# Patient Record
Sex: Male | Born: 1985 | Race: White | Hispanic: No | Marital: Single | Smoking: Current every day smoker
Health system: Southern US, Community
[De-identification: ages and names within clinical notes are randomized; demographics above are authoritative.]

## PROBLEM LIST (undated history)

## (undated) DIAGNOSIS — J45909 Unspecified asthma, uncomplicated: Secondary | ICD-10-CM

## (undated) DIAGNOSIS — IMO0002 Reserved for concepts with insufficient information to code with codable children: Secondary | ICD-10-CM

## (undated) DIAGNOSIS — Z8739 Personal history of other diseases of the musculoskeletal system and connective tissue: Secondary | ICD-10-CM

## (undated) DIAGNOSIS — F1910 Other psychoactive substance abuse, uncomplicated: Secondary | ICD-10-CM

## (undated) DIAGNOSIS — M79606 Pain in leg, unspecified: Secondary | ICD-10-CM

## (undated) DIAGNOSIS — I8390 Asymptomatic varicose veins of unspecified lower extremity: Secondary | ICD-10-CM

## (undated) HISTORY — PX: NO PAST SURGERIES: SHX2092

## (undated) HISTORY — DX: Reserved for concepts with insufficient information to code with codable children: IMO0002

## (undated) HISTORY — DX: Pain in leg, unspecified: M79.606

---
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
## 2008-06-13 ENCOUNTER — Emergency Department (HOSPITAL_COMMUNITY): Admission: EM | Admit: 2008-06-13 | Discharge: 2008-06-13 | Payer: Self-pay | Admitting: Emergency Medicine

## 2009-04-28 ENCOUNTER — Emergency Department (HOSPITAL_COMMUNITY): Admission: EM | Admit: 2009-04-28 | Discharge: 2009-04-28 | Payer: Self-pay | Admitting: Emergency Medicine

## 2009-05-14 ENCOUNTER — Emergency Department (HOSPITAL_COMMUNITY): Admission: EM | Admit: 2009-05-14 | Discharge: 2009-05-14 | Payer: Self-pay | Admitting: Emergency Medicine

## 2009-11-01 ENCOUNTER — Emergency Department (HOSPITAL_COMMUNITY): Admission: EM | Admit: 2009-11-01 | Discharge: 2009-11-01 | Payer: Self-pay | Admitting: Emergency Medicine

## 2010-03-22 ENCOUNTER — Emergency Department (HOSPITAL_COMMUNITY): Admission: EM | Admit: 2010-03-22 | Discharge: 2010-03-22 | Payer: Self-pay | Admitting: Emergency Medicine

## 2011-04-24 ENCOUNTER — Emergency Department (HOSPITAL_COMMUNITY)
Admission: EM | Admit: 2011-04-24 | Discharge: 2011-04-24 | Disposition: A | Discharge status: Home / Self Care | Payer: Self-pay | Attending: Emergency Medicine | Admitting: Emergency Medicine

## 2011-04-24 DIAGNOSIS — M25559 Pain in unspecified hip: Secondary | ICD-10-CM | POA: Insufficient documentation

## 2011-04-24 DIAGNOSIS — I8390 Asymptomatic varicose veins of unspecified lower extremity: Secondary | ICD-10-CM | POA: Insufficient documentation

## 2011-04-24 DIAGNOSIS — Z79899 Other long term (current) drug therapy: Secondary | ICD-10-CM | POA: Insufficient documentation

## 2011-04-24 DIAGNOSIS — M76899 Other specified enthesopathies of unspecified lower limb, excluding foot: Secondary | ICD-10-CM | POA: Insufficient documentation

## 2012-03-30 ENCOUNTER — Telehealth: Payer: Self-pay | Admitting: *Deleted

## 2012-03-30 NOTE — Telephone Encounter (Signed)
Douglas Morgan is calling on behalf of her son inquiring about consultation with physician at [facility] as her son is experiencing groin pain and is having pain from groin to foot and is having difficulty sleeping.  She said he also has varicose veins behind his knee that are painful.  Douglas Morgan states Douglas was seen by a cardiologist (she does not know physician or practice name) at age 26 and was told "that he needed stents placed in his legs." She says they deferred treatment because they have no health insurance. Douglas Morgan states he was seen at [facility] ED about 6 months ago.  Reviewed Douglas Morgan's ED report from 04-23-2012 in which his primary complaint was hip pain and diagnosis was bursitis of right and left hips and leg pain was mentioned due to varicose veins.  No imaging studies done on ED visit.  Will discuss findings with Dr. Arbie Cookey 03-31-2012 and call Douglas Morgan and Douglas Morgan with Dr. Bosie Helper recommendations.  Douglas Morgan, Douglas Morgan

## 2012-03-31 NOTE — Telephone Encounter (Signed)
Reviewed Douglas Morgan's symptoms and records of Douglas Morgan ED visit on 04-24-2011 with Dr. Arbie Cookey.  Dr. Arbie Cookey recommended Douglas Morgan see his primary care physician first and obtain referral if primary care physician deemed necessary.  Called Douglas Morgan ([name]'s mother) and gave her Dr. Bosie Helper recommendation and suggested she try to obtain medical records from the cardiologist visit referenced in phone call on 03-30-2012.  [name], [name]

## 2012-04-04 ENCOUNTER — Encounter (HOSPITAL_COMMUNITY): Payer: Self-pay

## 2012-04-04 ENCOUNTER — Emergency Department (HOSPITAL_COMMUNITY)
Admission: EM | Admit: 2012-04-04 | Discharge: 2012-04-04 | Disposition: A | Discharge status: Home / Self Care | Payer: Self-pay | Attending: Emergency Medicine | Admitting: Emergency Medicine

## 2012-04-04 DIAGNOSIS — M79609 Pain in unspecified limb: Secondary | ICD-10-CM

## 2012-04-04 DIAGNOSIS — F17200 Nicotine dependence, unspecified, uncomplicated: Secondary | ICD-10-CM | POA: Insufficient documentation

## 2012-04-04 DIAGNOSIS — I8390 Asymptomatic varicose veins of unspecified lower extremity: Secondary | ICD-10-CM

## 2012-04-04 DIAGNOSIS — Z9109 Other allergy status, other than to drugs and biological substances: Secondary | ICD-10-CM | POA: Insufficient documentation

## 2012-04-04 DIAGNOSIS — R590 Localized enlarged lymph nodes: Secondary | ICD-10-CM

## 2012-04-04 DIAGNOSIS — I83893 Varicose veins of bilateral lower extremities with other complications: Secondary | ICD-10-CM | POA: Insufficient documentation

## 2012-04-04 DIAGNOSIS — R599 Enlarged lymph nodes, unspecified: Secondary | ICD-10-CM | POA: Insufficient documentation

## 2012-04-04 HISTORY — DX: Asymptomatic varicose veins of unspecified lower extremity: I83.90

## 2012-04-04 MED ORDER — DOXYCYCLINE HYCLATE 100 MG PO CAPS: 100.0000 mg | ORAL_CAPSULE | Freq: Two times a day (BID) | ORAL | Status: AC

## 2012-04-04 MED ORDER — OXYCODONE-ACETAMINOPHEN 5-325 MG PO TABS: 1.0000 | ORAL_TABLET | ORAL | Status: AC | PRN

## 2012-04-04 NOTE — Progress Notes (Signed)
VASCULAR LAB PRELIMINARY  PRELIMINARY  PRELIMINARY  PRELIMINARY  Bilateral lower extremity venous duplex  completed.    Preliminary report:  Bilateral:  No evidence of DVT, superficial thrombosis, or Baker's Cyst.   Terance Hart, RVT 04/04/2012, 12:54 PM

## 2012-04-04 NOTE — ED Notes (Signed)
Pt presented to th ER with complaint of rt leg pain that got worse the last couple of days. Pt has a hx of  varicose veins to the rt leg and has gotten worse the last couple of weeks due to patient being on his feet a lot. Pt is ambulatory but is limping.

## 2012-04-04 NOTE — ED Provider Notes (Signed)
History   This chart was scribed for Dione Booze, MD by Shari Heritage. The patient was seen in room STRE8/STRE8. Patient's care was started at 1035.     CSN: [id number]  Arrival date & time 04/04/12  1035   First MD Initiated Contact with Patient 04/04/12 1126      Chief Complaint  Patient presents with  . Varicose Veins    (Consider location/radiation/quality/duration/timing/severity/associated sxs/prior treatment) Patient is a 26 y.o. male presenting with leg pain.  Leg Pain  The incident occurred more than 2 days ago. The incident occurred at home. There was no injury mechanism. The pain is present in the right leg and right thigh. The quality of the pain is described as throbbing and aching. The pain is at a severity of 8/10. The pain is severe. The pain has been constant since onset. Associated symptoms include numbness. The symptoms are aggravated by activity and bearing weight.   Douglas Morgan is a 27 y.o. male who presents to the Emergency Department complaining of moderate to severe right leg and groin pain with associated numbness onset several days ago. Patient complains of pain in his right ankle, anterior right lower leg, and groin. Patient describes pain as achy and throbbing. Patient says pain is 8/10. Patient says he began having this pain when he was 19. Patient says that pain is often aggravated by his work [employer]). Patient is ambulatory.  Patient's mother explains that when he was first seen by a cardiologist at age 34, that patient is able to pump blood into his legs but not out of his legs. Patient says he received these results through an ultrasound. Patient reports that he needs a referral to [facility] [address].  Patient has an allergy to pollen extract. Patient with h/o varicose veins. Patient is a current everyday smoker. Patient says he doesn't drink.  Patient has no PCP.  Past Medical History  Diagnosis Date  . Varicose veins      History reviewed. No pertinent past surgical history.  No family history on file.  History  Substance Use Topics  . Smoking status: Current Everyday Smoker -- 0.5 packs/day  . Smokeless tobacco: Not on file  . Alcohol Use: Yes     very rarely      Review of Systems  Neurological: Positive for numbness.   A complete 10 system review of systems was obtained and all systems are negative except as noted in the HPI and PMH.   Allergies  Pollen extract  Home Medications  No current outpatient prescriptions on file.  BP 109/79  Pulse 80  Temp 98.3 F (36.8 C) (Oral)  Resp 16  Ht 6\' 4"  (1.93 m)  Wt 187 lb (84.823 kg)  BMI 22.76 kg/m2  SpO2 99%  Physical Exam  Nursing note and vitals reviewed. Constitutional: He is oriented to person, place, and time. He appears well-developed and well-nourished. No distress.  HENT:  Head: Normocephalic and atraumatic.  Eyes: Conjunctivae and EOM are normal.  Neck: Neck supple.  Cardiovascular: Normal rate.   Pulmonary/Chest: Effort normal. No respiratory distress.  Musculoskeletal: Normal range of motion.       Right upper leg: He exhibits tenderness. He exhibits no edema.       Right lower leg: He exhibits tenderness. He exhibits no edema.       Varicose veins present on the right leg. Right calf and right thigh have mild tenderness and palpation. No edema. No cord. No  erythema. No difference in calf circumference. Early varicose vein formation on the left calf. Bilateral inguinal adenopathy which is mildly tender.  Neurological: He is alert and oriented to person, place, and time. No sensory deficit.  Skin: Skin is dry.       Well circumcised at area of erythema in the right anterior lower leg approximately 2 cm in diameter. Lesion is flat does not seem to be related in any way to the varicose veins. Most likely a contact dermatitis.  Psychiatric: He has a normal mood and affect. His behavior is normal.    ED Course  Procedures  (including critical care time) DIAGNOSTIC STUDIES: Oxygen Saturation is 99% on room air, normal by my interpretation.    COORDINATION OF CARE: 11:40AM- Patient informed of current plan for treatment and evaluation and agrees with plan at this time. Will order an ultrasound.    1. Varicose veins   2. Inguinal lymphadenopathy       MDM  Varicose veins without evidence on physical exam of DVT. Patient states that he was told to calm me here by the vein specialists in order to be referred back to the vein specialists. Venous Doppler will be obtained to rule out occult DVT. Inguinal pain seems most likely due 2 inguinal lymphadenopathy. I do not see any evidence of iliofemoral thrombophlebitis. Prior records are reviewed and apparently he has a telephone conversation with the nurse on call for vascular and vein specialists 5 days ago and he was advised to get a referral from his primary care provider to be seen there. He also has a prior ED visit for what was diagnosed as bursitis of his right hip. I do not see any records of cardiology evaluation or a prior evaluation by vascular or vein specialists and had no mention of varicose veins.  Ultrasound showed no evidence of DVT. Cause of the size of his veins, and you feel it would be prudent to take daily aspirin to try to prevent superficial thrombophlebitis. He is referred to vascular and vein specialists and is given a prescription for Percocet for pain. Inguinal lymphadenopathy is being treated with a prescription for doxycycline.      I personally performed the services described in this documentation, which was scribed in my presence. The recorded information has been reviewed and considered.      Dione Booze, MD 04/04/12 1335

## 2012-04-04 NOTE — Discharge Instructions (Signed)
Take one aspirin every day. Make an appointment with a pain specialist to see what options you have to treat your varicose veins. Take Tylenol or ibuprofen for less severe pain.  Lymphadenopathy Lymphadenopathy means "disease of the lymph glands." But the term is usually used to describe swollen or enlarged lymph glands, also called lymph nodes. These are the bean-shaped organs found in many locations including the neck, underarm, and groin. Lymph glands are part of the immune system, which fights infections in your body. Lymphadenopathy can occur in just one area of the body, such as the neck, or it can be generalized, with lymph node enlargement in several areas. The nodes found in the neck are the most common sites of lymphadenopathy. CAUSES  When your immune system responds to germs (such as viruses or bacteria ), infection-fighting cells and fluid build up. This causes the glands to grow in size. This is usually not something to worry about. Sometimes, the glands themselves can become infected and inflamed. This is called lymphadenitis. Enlarged lymph nodes can be caused by many diseases:  Bacterial disease, such as strep throat or a skin infection.   Viral disease, such as a common cold.   Other germs, such as lyme disease, tuberculosis, or sexually transmitted diseases.   Cancers, such as lymphoma (cancer of the lymphatic system) or leukemia (cancer of the white blood cells).   Inflammatory diseases such as lupus or rheumatoid arthritis.   Reactions to medications.  Many of the diseases above are rare, but important. This is why you should see your caregiver if you have lymphadenopathy. SYMPTOMS   Swollen, enlarged lumps in the neck, back of the head or other locations.   Tenderness.   Warmth or redness of the skin over the lymph nodes.   Fever.  DIAGNOSIS  Enlarged lymph nodes are often near the source of infection. They can help healthcare providers diagnose your illness. For  instance:   Swollen lymph nodes around the jaw might be caused by an infection in the mouth.   Enlarged glands in the neck often signal a throat infection.   Lymph nodes that are swollen in more than one area often indicate an illness caused by a virus.  Your caregiver most likely will know what is causing your lymphadenopathy after listening to your history and examining you. Blood tests, x-rays or other tests may be needed. If the cause of the enlarged lymph node cannot be found, and it does not go away by itself, then a biopsy may be needed. Your caregiver will discuss this with you. TREATMENT  Treatment for your enlarged lymph nodes will depend on the cause. Many times the nodes will shrink to normal size by themselves, with no treatment. Antibiotics or other medicines may be needed for infection. Only take over-the-counter or prescription medicines for pain, discomfort or fever as directed by your caregiver. HOME CARE INSTRUCTIONS  Swollen lymph glands usually return to normal when the underlying medical condition goes away. If they persist, contact your health-care provider. He/she might prescribe antibiotics or other treatments, depending on the diagnosis. Take any medications exactly as prescribed. Keep any follow-up appointments made to check on the condition of your enlarged nodes.  SEEK MEDICAL CARE IF:   Swelling lasts for more than two weeks.   You have symptoms such as weight loss, night sweats, fatigue or fever that does not go away.   The lymph nodes are hard, seem fixed to the skin or are growing rapidly.  Skin over the lymph nodes is red and inflamed. This could mean there is an infection.  SEEK IMMEDIATE MEDICAL CARE IF:   Fluid starts leaking from the area of the enlarged lymph node.   You develop a fever of 102 F (38.9 C) or greater.   Severe pain develops (not necessarily at the site of a large lymph node).   You develop chest pain or shortness of breath.   You  develop worsening abdominal pain.  MAKE SURE YOU:   Understand these instructions.   Will watch your condition.   Will get help right away if you are not doing well or get worse.  Document Released: 07/14/2008 Document Revised: 09/24/2011 Document Reviewed: 07/14/2008 [facility] Patient Information 2012 [place], [place].  Varicose Veins Varicose veins are veins that have become enlarged and twisted. CAUSES This condition is the result of valves in the veins not working properly. Valves in the veins help return blood from the leg to the heart. If these valves are damaged, blood flows backwards and backs up into the veins in the leg near the skin. This causes the veins to become larger. People who are on their feet a lot, who are pregnant, or who are overweight are more likely to develop varicose veins. SYMPTOMS   Bulging, twisted-appearing, bluish veins, most commonly found on the legs.   Leg pain or a feeling of heaviness. These symptoms may be worse at the end of the day.   Leg swelling.   Skin color changes.  DIAGNOSIS  Varicose veins can usually be diagnosed with an exam of your legs by your caregiver. He or she may recommend an ultrasound of your leg veins. TREATMENT  Most varicose veins can be treated at home.However, other treatments are available for people who have persistent symptoms or who want to treat the cosmetic appearance of the varicose veins. These include:  Laser treatment of very small varicose veins.   Medicine that is shot (injected) into the vein. This medicine hardens the walls of the vein and closes off the vein. This treatment is called sclerotherapy. Afterwards, you may need to wear clothing or bandages that apply pressure.   Surgery.  HOME CARE INSTRUCTIONS   Do not stand or sit in one position for long periods of time. Do not sit with your legs crossed. Rest with your legs raised during the day.   Wear elastic stockings or support hose. Do not wear  other tight, encircling garments around the legs, pelvis, or waist.   Walk as much as possible to increase blood flow.   Raise the foot of your bed at night with 2-inch blocks.   If you get a cut in the skin over the vein and the vein bleeds, lie down with your leg raised and press on it with a clean cloth until the bleeding stops. Then place a bandage (dressing) on the cut. See your caregiver if it continues to bleed or needs stitches.  SEEK MEDICAL CARE IF:   The skin around your ankle starts to break down.   You have pain, redness, tenderness, or hard swelling developing in your leg over a vein.   You are uncomfortable due to leg pain.  Document Released: 07/15/2005 Document Revised: 09/24/2011 Document Reviewed: 12/01/2010 [facility] Patient Information 2012 [place], [place].  Doxycycline tablets or capsules What is this medicine? DOXYCYCLINE (dox i SYE kleen) is a tetracycline antibiotic. It kills certain bacteria or stops their growth. It is used to treat many kinds of infections,  like dental, skin, respiratory, and urinary tract infections. It also treats acne, Lyme disease, malaria, and certain sexually transmitted infections. This medicine may be used for other purposes; ask your health care provider or pharmacist if you have questions. What should I tell my health care provider before I take this medicine? They need to know if you have any of these conditions: -liver disease -long exposure to sunlight like working outdoors -stomach problems like colitis -an unusual or allergic reaction to doxycycline, tetracycline antibiotics, other medicines, foods, dyes, or preservatives -pregnant or trying to get pregnant -breast-feeding How should I use this medicine? Take this medicine by mouth with a full glass of water. Follow the directions on the prescription label. It is best to take this medicine without food, but if it upsets your stomach take it with food. Take your medicine at  regular intervals. Do not take your medicine more often than directed. Take all of your medicine as directed even if you think you are better. Do not skip doses or stop your medicine early. Talk to your pediatrician regarding the use of this medicine in children. Special care may be needed. While this drug may be prescribed for children as young as 67 years old for selected conditions, precautions do apply. Overdosage: If you think you have taken too much of this medicine contact a poison control center or emergency room at once. NOTE: This medicine is only for you. Do not share this medicine with others. What if I miss a dose? If you miss a dose, take it as soon as you can. If it is almost time for your next dose, take only that dose. Do not take double or extra doses. What may interact with this medicine? -antacids -barbiturates -birth control pills -bismuth subsalicylate -carbamazepine -methoxyflurane -other antibiotics -phenytoin -vitamins that contain iron -warfarin This list may not describe all possible interactions. Give your health care provider a list of all the medicines, herbs, non-prescription drugs, or dietary supplements you use. Also tell them if you smoke, drink alcohol, or use illegal drugs. Some items may interact with your medicine. What should I watch for while using this medicine? Tell your doctor or health care professional if your symptoms do not improve. Do not treat diarrhea with over the counter products. Contact your doctor if you have diarrhea that lasts more than 2 days or if it is severe and watery. Do not take this medicine just before going to bed. It may not dissolve properly when you lay down and can cause pain in your throat. Drink plenty of fluids while taking this medicine to also help reduce irritation in your throat. This medicine can make you more sensitive to the sun. Keep out of the sun. If you cannot avoid being in the sun, wear protective clothing and  use sunscreen. Do not use sun lamps or tanning beds/booths. Birth control pills may not work properly while you are taking this medicine. Talk to your doctor about using an extra method of birth control. If you are being treated for a sexually transmitted infection, avoid sexual contact until you have finished your treatment. Your sexual partner may also need treatment. Avoid antacids, aluminum, calcium, magnesium, and iron products for 4 hours before and 2 hours after taking a dose of this medicine. If you are using this medicine to prevent malaria, you should still protect yourself from contact with mosquitos. Stay in screened-in areas, use mosquito nets, keep your body covered, and use an insect repellent. What side  effects may I notice from receiving this medicine? Side effects that you should report to your doctor or health care professional as soon as possible: -allergic reactions like skin rash, itching or hives, swelling of the face, lips, or tongue -difficulty breathing -fever -itching in the rectal or genital area -pain on swallowing -redness, blistering, peeling or loosening of the skin, including inside the mouth -severe stomach pain or cramps -unusual bleeding or bruising -unusually weak or tired -yellowing of the eyes or skin Side effects that usually do not require medical attention (report to your doctor or health care professional if they continue or are bothersome): -diarrhea -loss of appetite -nausea, vomiting This list may not describe all possible side effects. Call your doctor for medical advice about side effects. You may report side effects to FDA at 1-800-FDA-1088. Where should I keep my medicine? Keep out of the reach of children. Store at room temperature, below 30 degrees C (86 degrees F). Protect from light. Keep container tightly closed. Throw away any unused medicine after the expiration date. Taking this medicine after the expiration date can make you seriously  ill. NOTE: This sheet is a summary. It may not cover all possible information. If you have questions about this medicine, talk to your doctor, pharmacist, or health care provider.  2012, [employer]. (01/24/2008 4:53:02 PM)  Acetaminophen; Oxycodone tablets What is this medicine? ACETAMINOPHEN; OXYCODONE (a set a MEE noe fen; ox i KOE done) is a pain reliever. It is used to treat mild to moderate pain. This medicine may be used for other purposes; ask your health care provider or pharmacist if you have questions. What should I tell my health care provider before I take this medicine? They need to know if you have any of these conditions: -brain tumor -Crohn's disease, inflammatory bowel disease, or ulcerative colitis -drink more than 3 alcohol containing drinks per day -drug abuse or addiction -head injury -heart or circulation problems -kidney disease or problems going to the bathroom -liver disease -lung disease, asthma, or breathing problems -an unusual or allergic reaction to acetaminophen, oxycodone, other opioid analgesics, other medicines, foods, dyes, or preservatives -pregnant or trying to get pregnant -breast-feeding How should I use this medicine? Take this medicine by mouth with a full glass of water. Follow the directions on the prescription label. Take your medicine at regular intervals. Do not take your medicine more often than directed. Talk to your pediatrician regarding the use of this medicine in children. Special care may be needed. Patients over 7 years old may have a stronger reaction and need a smaller dose. Overdosage: If you think you have taken too much of this medicine contact a poison control center or emergency room at once. NOTE: This medicine is only for you. Do not share this medicine with others. What if I miss a dose? If you miss a dose, take it as soon as you can. If it is almost time for your next dose, take only that dose. Do not take  double or extra doses. What may interact with this medicine? -alcohol or medicines that contain alcohol -antihistamines -barbiturates like amobarbital, butalbital, butabarbital, methohexital, pentobarbital, phenobarbital, thiopental, and secobarbital -benztropine -drugs for bladder problems like solifenacin, trospium, oxybutynin, tolterodine, hyoscyamine, and methscopolamine -drugs for breathing problems like ipratropium and tiotropium -drugs for certain stomach or intestine problems like propantheline, homatropine methylbromide, glycopyrrolate, atropine, belladonna, and dicyclomine -general anesthetics like etomidate, ketamine, nitrous oxide, propofol, desflurane, enflurane, halothane, isoflurane, and sevoflurane -medicines for depression, anxiety, or  psychotic disturbances -medicines for pain like codeine, morphine, pentazocine, buprenorphine, butorphanol, nalbuphine, tramadol, and propoxyphene -medicines for sleep -muscle relaxants -naltrexone -phenothiazines like perphenazine, thioridazine, chlorpromazine, mesoridazine, fluphenazine, prochlorperazine, promazine, and trifluoperazine -scopolamine -trihexyphenidyl This list may not describe all possible interactions. Give your health care provider a list of all the medicines, herbs, non-prescription drugs, or dietary supplements you use. Also tell them if you smoke, drink alcohol, or use illegal drugs. Some items may interact with your medicine. What should I watch for while using this medicine? Tell your doctor or health care professional if your pain does not go away, if it gets worse, or if you have new or a different type of pain. You may develop tolerance to the medicine. Tolerance means that you will need a higher dose of the medication for pain relief. Tolerance is normal and is expected if you take this medicine for a long time. Do not suddenly stop taking your medicine because you may develop a severe reaction. Your body becomes used  to the medicine. This does NOT mean you are addicted. Addiction is a behavior related to getting and using a drug for a nonmedical reason. If you have pain, you have a medical reason to take pain medicine. Your doctor will tell you how much medicine to take. If your doctor wants you to stop the medicine, the dose will be slowly lowered over time to avoid any side effects. You may get drowsy or dizzy. Do not drive, use machinery, or do anything that needs mental alertness until you know how this medicine affects you. Do not stand or sit up quickly, especially if you are an older patient. This reduces the risk of dizzy or fainting spells. Alcohol may interfere with the effect of this medicine. Avoid alcoholic drinks. The medicine will cause constipation. Try to have a bowel movement at least every 2 to 3 days. If you do not have a bowel movement for 3 days, call your doctor or health care professional. Do not take Tylenol (acetaminophen) or medicines that have acetaminophen with this medicine. Too much acetaminophen can be very dangerous. Many nonprescription medicines contain acetaminophen. Always read the labels carefully to avoid taking more acetaminophen. What side effects may I notice from receiving this medicine? Side effects that you should report to your doctor or health care professional as soon as possible: -allergic reactions like skin rash, itching or hives, swelling of the face, lips, or tongue -breathing difficulties, wheezing -confusion -light headedness or fainting spells -severe stomach pain -yellowing of the skin or the whites of the eyes Side effects that usually do not require medical attention (report to your doctor or health care professional if they continue or are bothersome): -dizziness -drowsiness -nausea -vomiting This list may not describe all possible side effects. Call your doctor for medical advice about side effects. You may report side effects to FDA at  1-800-FDA-1088. Where should I keep my medicine? Keep out of the reach of children. This medicine can be abused. Keep your medicine in a safe place to protect it from theft. Do not share this medicine with anyone. Selling or giving away this medicine is dangerous and against the law. Store at room temperature between 20 and 25 degrees C (68 and 77 degrees F). Keep container tightly closed. Protect from light. Flush any unused medicines down the toilet. Do not use the medicine after the expiration date. NOTE: This sheet is a summary. It may not cover all possible information. If you have questions about  this medicine, talk to your doctor, pharmacist, or health care provider.  2012, [employer]. (09/03/2008 10:01:21 AM)  Smoking Hazards Smoking cigarettes is extremely bad for your health. Tobacco smoke has over 200 known poisons in it. There are over 60 chemicals in tobacco smoke that cause cancer. Some of the chemicals found in cigarette smoke include:   Cyanide.   Benzene.   Formaldehyde.   Methanol (wood alcohol).   Acetylene (fuel used in welding torches).   Ammonia.  Cigarette smoke also contains the poisonous gases nitrogen oxide and carbon monoxide.  Cigarette smokers have an increased risk of many serious medical problems, including:  Lung cancer.   Lung disease (such as pneumonia, bronchitis, and emphysema).   Heart attack and chest pain due to the heart not getting enough oxygen (angina).   Heart disease and peripheral blood vessel disease.   Hypertension.   Stroke.   Oral cancer (cancer of the lip, mouth, or voice box).   Bladder cancer.   Pancreatic cancer.   Cervical cancer.   Pregnancy complications, including premature birth.   Low birthweight babies.   Early menopause.   Lower estrogen level for women.   Infertility.   Facial wrinkles.   Blindness.   Increased risk of broken bones (fractures).   Senile dementia.   Stillbirths and  smaller newborn babies, birth defects, and genetic damage to sperm.   Stomach ulcers and internal bleeding.  Children of smokers have an increased risk of the following, because of secondhand smoke exposure:   Sudden infant death syndrome (SIDS).   Respiratory infections.   Lung cancer.   Heart disease.   Ear infections.  Smoking causes approximately:  90% of all lung cancer deaths in men.   80% of all lung cancer deaths in women.   90% of deaths from chronic obstructive lung disease.  Compared with nonsmokers, smoking increases the risk of:  Coronary heart disease by 2 to 4 times.   Stroke by 2 to 4 times.   Men developing lung cancer by 23 times.   Women developing lung cancer by 13 times.   Dying from chronic obstructive lung diseases by 12 times.  Someone who smokes 2 packs a day loses about 8 years of his or her life. Even smoking lightly shortens your life expectancy by several years. You can greatly reduce the risk of medical problems for you and your family by stopping now. Smoking is the most preventable cause of death and disease in our society. Within days of quitting smoking, your circulation returns to normal, you decrease the risk of having a heart attack, and your lung capacity improves. There may be some increased phlegm in the first few days after quitting, and it may take months for your lungs to clear up completely. Quitting for 10 years cuts your lung cancer risk to almost that of a nonsmoker. WHY IS SMOKING ADDICTIVE?  Nicotine is the chemical agent in tobacco that is capable of causing addiction or dependence.   When you smoke and inhale, nicotine is absorbed rapidly into the bloodstream through your lungs. Nicotine absorbed through the lungs is capable of creating a powerful addiction. Both inhaled and non-inhaled nicotine may be addictive.   Addiction studies of cigarettes and spit tobacco show that addiction to nicotine occurs mainly during the teen  years, when young people begin using tobacco products.  WHAT ARE THE BENEFITS OF QUITTING?  There are many health benefits to quitting smoking.   Likelihood of developing cancer and  heart disease decreases. Health improvements are seen almost immediately.   Blood pressure, pulse rate, and breathing patterns start returning to normal soon after quitting.   People who quit may see an improvement in their overall quality of life.  Some people choose to quit all at once. Other options include nicotine replacement products, such as patches, gum, and nasal sprays. Do not use these products without first checking with your caregiver. QUITTING SMOKING It is not easy to quit smoking. Nicotine is addicting, and longtime habits are hard to change. To start, you can write down all your reasons for quitting, tell your family and friends you want to quit, and ask for their help. Throw your cigarettes away, chew gum or cinnamon sticks, keep your hands busy, and drink extra water or juice. Go for walks and practice deep breathing to relax. Think of all the money you are saving: around $1,000 a year, for the average pack-a-day smoker. Nicotine patches and gum have been shown to improve success at efforts to stop smoking. Zyban (bupropion) is an anti-depressant drug that can be prescribed to reduce nicotine withdrawal symptoms and to suppress the urge to smoke. Smoking is an addiction with both physical and psychological effects. Joining a stop-smoking support group can help you cope with the emotional issues. For more information and advice on programs to stop smoking, call your doctor, your local hospital, or these organizations:  American Lung Association - 1-800-LUNGUSA   American Cancer Society - 1-800-ACS-2345  Document Released: 11/12/2004 Document Revised: 09/24/2011 Document Reviewed: 07/17/2009 [facility] Patient Information 2012 [place], [place].

## 2012-04-04 NOTE — ED Notes (Signed)
Patient transported to Ultrasound 

## 2012-07-20 ENCOUNTER — Encounter (HOSPITAL_COMMUNITY): Payer: Self-pay | Admitting: Adult Health

## 2012-07-20 ENCOUNTER — Emergency Department (HOSPITAL_COMMUNITY): Payer: Self-pay

## 2012-07-20 ENCOUNTER — Emergency Department (HOSPITAL_COMMUNITY)
Admission: EM | Admit: 2012-07-20 | Discharge: 2012-07-20 | Disposition: A | Discharge status: Home / Self Care | Payer: Self-pay | Attending: Emergency Medicine | Admitting: Emergency Medicine

## 2012-07-20 DIAGNOSIS — M940 Chondrocostal junction syndrome [Tietze]: Secondary | ICD-10-CM | POA: Insufficient documentation

## 2012-07-20 DIAGNOSIS — F17200 Nicotine dependence, unspecified, uncomplicated: Secondary | ICD-10-CM | POA: Insufficient documentation

## 2012-07-20 DIAGNOSIS — F411 Generalized anxiety disorder: Secondary | ICD-10-CM | POA: Insufficient documentation

## 2012-07-20 DIAGNOSIS — R0602 Shortness of breath: Secondary | ICD-10-CM | POA: Insufficient documentation

## 2012-07-20 DIAGNOSIS — R002 Palpitations: Secondary | ICD-10-CM | POA: Insufficient documentation

## 2012-07-20 DIAGNOSIS — R209 Unspecified disturbances of skin sensation: Secondary | ICD-10-CM | POA: Insufficient documentation

## 2012-07-20 DIAGNOSIS — R079 Chest pain, unspecified: Secondary | ICD-10-CM | POA: Insufficient documentation

## 2012-07-20 LAB — RAPID URINE DRUG SCREEN, HOSP PERFORMED
Amphetamines: NOT DETECTED
Barbiturates: NOT DETECTED
Benzodiazepines: POSITIVE — AB
Cocaine: POSITIVE — AB
Opiates: NOT DETECTED
Tetrahydrocannabinol: POSITIVE — AB

## 2012-07-20 LAB — POCT I-STAT TROPONIN I: Troponin i, poc: 0 ng/mL (ref 0.00–0.08)

## 2012-07-20 LAB — BASIC METABOLIC PANEL
BUN: 9 mg/dL (ref 6–23)
CO2: 21 mEq/L (ref 19–32)
Calcium: 10.2 mg/dL (ref 8.4–10.5)
Chloride: 103 mEq/L (ref 96–112)
Creatinine, Ser: 1.11 mg/dL (ref 0.50–1.35)
GFR calc Af Amer: >90 mL/min (ref >90)
GFR calc non Af Amer: >90 mL/min (ref >90)
Glucose, Bld: 114 mg/dL — ABNORMAL HIGH (ref 70–99)
Potassium: 3.6 mEq/L (ref 3.5–5.1)
Sodium: 139 mEq/L (ref 135–145)

## 2012-07-20 LAB — D-DIMER, QUANTITATIVE (NOT AT ARMC): D-Dimer, Quant: <0.27 ug/mL-FEU (ref 0.00–0.48)

## 2012-07-20 LAB — CBC
HCT: 43.1 % (ref 39.0–52.0)
Hemoglobin: 14.9 g/dL (ref 13.0–17.0)
MCH: 31.4 pg (ref 26.0–34.0)
MCHC: 34.6 g/dL (ref 30.0–36.0)
MCV: 90.7 fL (ref 78.0–100.0)
Platelets: 272 10*3/uL (ref 150–400)
RBC: 4.75 MIL/uL (ref 4.22–5.81)
RDW: 12.4 % (ref 11.5–15.5)
WBC: 8.8 10*3/uL (ref 4.0–10.5)

## 2012-07-20 MED ORDER — LORAZEPAM 1 MG PO TABS
1.0000 mg | ORAL_TABLET | Freq: Once | ORAL | Status: AC
  Administered 2012-07-20: 1 mg via ORAL
  Filled 2012-07-20: qty 1

## 2012-07-20 MED ORDER — IBUPROFEN 600 MG PO TABS: 600.0000 mg | ORAL_TABLET | Freq: Four times a day (QID) | ORAL | Status: DC | PRN

## 2012-07-20 NOTE — ED Provider Notes (Signed)
History     CSN: [id number]  Arrival date & time 07/20/12  1551   First MD Initiated Contact with Patient 07/20/12 1708      Chief Complaint  Patient presents with  . Chest Pain    (Consider location/radiation/quality/duration/timing/severity/associated sxs/prior treatment) HPI Pt with L anterior chest pain. Pain is sharp and worse with deep breathing. Associated with SOB, tingling of face and hands. Pt states it started this morning but his mother states he was c/o about the pain last night. Pt is tearful and anxious. Says his girlfriend is leaving him. No recent travel, surgeries, family hx of DVT/PE.  Past Medical History  Diagnosis Date  . Varicose veins     History reviewed. No pertinent past surgical history.  History reviewed. No pertinent family history.  History  Substance Use Topics  . Smoking status: Current Every Day Smoker -- 0.5 packs/day  . Smokeless tobacco: Not on file  . Alcohol Use: Yes     very rarely      Review of Systems  Constitutional: Negative for fever and chills.  Respiratory: Positive for shortness of breath. Negative for cough and wheezing.   Cardiovascular: Positive for chest pain and palpitations. Negative for leg swelling.  Gastrointestinal: Negative for nausea, vomiting, abdominal pain and diarrhea.  Skin: Negative for rash and wound.  Neurological: Positive for numbness. Negative for dizziness, syncope, weakness and headaches.  Psychiatric/Behavioral: The patient is nervous/anxious.     Allergies  Pollen extract  Home Medications   Current Outpatient Rx  Name Route Sig Dispense Refill  . IBUPROFEN 600 MG PO TABS Oral Take 1 tablet (600 mg total) by mouth every 6 (six) hours as needed for pain. 30 tablet 0    BP 134/69  Pulse 99  Temp 97.7 F (36.5 C)  Resp 20  SpO2 100%  Physical Exam  Nursing note and vitals reviewed. Constitutional: He is oriented to person, place, and time. He appears well-developed and  well-nourished. No distress.       Anxious, tearful and hyperventilating  HENT:  Head: Normocephalic and atraumatic.  Mouth/Throat: Oropharynx is clear and moist.  Eyes: EOM are normal. Pupils are equal, round, and reactive to light.  Neck: Normal range of motion. Neck supple.  Cardiovascular: Normal rate and regular rhythm.   Pulmonary/Chest: Effort normal and breath sounds normal. No respiratory distress. He has no wheezes. He has no rales. He exhibits tenderness (Chest tenderness reproduced with palpation of L anterior costal cartilage. No evidence of trauma or infection).  Abdominal: Soft. Bowel sounds are normal. He exhibits no mass. There is no tenderness. There is no rebound and no guarding.  Musculoskeletal: Normal range of motion. He exhibits no edema and no tenderness.       No calf swelling or tenderness  Neurological: He is alert and oriented to person, place, and time.       Moves all ext without deficit  Skin: Skin is warm and dry. No rash noted. No erythema.  Psychiatric:       anxious    ED Course  Procedures (including critical care time)  Labs Reviewed  BASIC METABOLIC PANEL - Abnormal; Notable for the following:    Glucose, Bld 114 (*)     All other components within normal limits  URINE RAPID DRUG SCREEN (HOSP PERFORMED) - Abnormal; Notable for the following:    Cocaine POSITIVE (*)     Benzodiazepines POSITIVE (*)     Tetrahydrocannabinol POSITIVE (*)  All other components within normal limits  CBC  POCT I-STAT TROPONIN I  D-DIMER, QUANTITATIVE   Dg Chest 2 View  07/20/2012  *RADIOLOGY REPORT*  Clinical Data: Sharp chest pain, shortness of breath  CHEST - 2 VIEW  Comparison: None.  Findings: No active infiltrate or effusion is seen.  Mediastinal contours appear normal.  There is no evidence of pneumothorax or pneumomediastinum.  The heart is within normal limits in size.  No bony abnormality is seen.  IMPRESSION: No active lung disease.   Original Report  Authenticated By: Juline Patch, M.D.      1. Costochondritis       Date: 07/20/2012  Rate:93  Rhythm: normal sinus rhythm  QRS Axis: normal  Intervals: normal  ST/T Wave abnormalities: normal  Conduction Disutrbances:none  Narrative Interpretation:   Old EKG Reviewed: none available Poor quality due to wavering baseline  MDM  Costochondritis vs PE vs anxiety.   Pt resting more comfortably after ativan.   Do not suspect CAD. D-dimer neg. D/c home with chest wall pain      Loren Racer, MD 07/20/12 1946

## 2012-07-20 NOTE — ED Notes (Addendum)
Reports left sided chest pain that is described as sharp associated with SOB, dizziness and numbness and tingling of the hands and face. Denies nausea. Pt is anxious, breathing very fast. Explained and taught deep slow breathing exercises.

## 2012-08-08 ENCOUNTER — Emergency Department (HOSPITAL_COMMUNITY)
Admission: EM | Admit: 2012-08-08 | Discharge: 2012-08-08 | Disposition: A | Discharge status: Other Acute Inpatient Hospital | Payer: Self-pay | Attending: Emergency Medicine | Admitting: Emergency Medicine

## 2012-08-08 ENCOUNTER — Telehealth (HOSPITAL_COMMUNITY): Payer: Self-pay | Admitting: Licensed Clinical Social Worker

## 2012-08-08 ENCOUNTER — Encounter (HOSPITAL_COMMUNITY): Payer: Self-pay | Admitting: *Deleted

## 2012-08-08 DIAGNOSIS — R45850 Homicidal ideations: Secondary | ICD-10-CM | POA: Insufficient documentation

## 2012-08-08 DIAGNOSIS — R45851 Suicidal ideations: Secondary | ICD-10-CM | POA: Insufficient documentation

## 2012-08-08 DIAGNOSIS — F17200 Nicotine dependence, unspecified, uncomplicated: Secondary | ICD-10-CM | POA: Insufficient documentation

## 2012-08-08 DIAGNOSIS — F1910 Other psychoactive substance abuse, uncomplicated: Secondary | ICD-10-CM | POA: Insufficient documentation

## 2012-08-08 LAB — CBC
HCT: 46.7 % (ref 39.0–52.0)
Hemoglobin: 15.9 g/dL (ref 13.0–17.0)
MCH: 32.1 pg (ref 26.0–34.0)
MCHC: 34 g/dL (ref 30.0–36.0)
MCV: 94.3 fL (ref 78.0–100.0)
Platelets: 281 10*3/uL (ref 150–400)
RBC: 4.95 MIL/uL (ref 4.22–5.81)
RDW: 12.8 % (ref 11.5–15.5)
WBC: 10.5 10*3/uL (ref 4.0–10.5)

## 2012-08-08 LAB — COMPREHENSIVE METABOLIC PANEL
ALT: 21 U/L (ref 0–53)
AST: 32 U/L (ref 0–37)
Albumin: 4.4 g/dL (ref 3.5–5.2)
Alkaline Phosphatase: 74 U/L (ref 39–117)
BUN: 12 mg/dL (ref 6–23)
CO2: 26 mEq/L (ref 19–32)
Calcium: 9.4 mg/dL (ref 8.4–10.5)
Chloride: 100 mEq/L (ref 96–112)
Creatinine, Ser: 0.99 mg/dL (ref 0.50–1.35)
GFR calc Af Amer: >90 mL/min (ref >90)
GFR calc non Af Amer: >90 mL/min (ref >90)
Glucose, Bld: 106 mg/dL — ABNORMAL HIGH (ref 70–99)
Potassium: 3.9 mEq/L (ref 3.5–5.1)
Sodium: 137 mEq/L (ref 135–145)
Total Bilirubin: 0.8 mg/dL (ref 0.3–1.2)
Total Protein: 7.9 g/dL (ref 6.0–8.3)

## 2012-08-08 LAB — RAPID URINE DRUG SCREEN, HOSP PERFORMED
Amphetamines: NOT DETECTED
Barbiturates: NOT DETECTED
Benzodiazepines: POSITIVE — AB
Cocaine: POSITIVE — AB
Opiates: NOT DETECTED
Tetrahydrocannabinol: POSITIVE — AB

## 2012-08-08 LAB — ETHANOL: Alcohol, Ethyl (B): <11 mg/dL (ref 0–11)

## 2012-08-08 MED ORDER — CITALOPRAM HYDROBROMIDE 20 MG PO TABS
20.0000 mg | ORAL_TABLET | Freq: Every day | ORAL | Status: DC
  Administered 2012-08-08: 20 mg via ORAL
  Filled 2012-08-08: qty 1

## 2012-08-08 MED ORDER — ONDANSETRON HCL 4 MG PO TABS: 4.0000 mg | ORAL_TABLET | Freq: Three times a day (TID) | ORAL | Status: DC | PRN

## 2012-08-08 MED ORDER — ACETAMINOPHEN 325 MG PO TABS: 650.0000 mg | ORAL_TABLET | ORAL | Status: DC | PRN

## 2012-08-08 MED ORDER — ZOLPIDEM TARTRATE 5 MG PO TABS: 5.0000 mg | ORAL_TABLET | Freq: Every evening | ORAL | Status: DC | PRN

## 2012-08-08 MED ORDER — ALUM & MAG HYDROXIDE-SIMETH 200-200-20 MG/5ML PO SUSP: 30.0000 mL | ORAL | Status: DC | PRN

## 2012-08-08 MED ORDER — NICOTINE 21 MG/24HR TD PT24: 21.0000 mg | MEDICATED_PATCH | Freq: Every day | TRANSDERMAL | Status: DC

## 2012-08-08 MED ORDER — IBUPROFEN 600 MG PO TABS: 600.0000 mg | ORAL_TABLET | Freq: Three times a day (TID) | ORAL | Status: DC | PRN

## 2012-08-08 MED ORDER — LORAZEPAM 1 MG PO TABS: 1.0000 mg | ORAL_TABLET | Freq: Three times a day (TID) | ORAL | Status: DC | PRN

## 2012-08-08 NOTE — ED Provider Notes (Addendum)
4:40 PM seen by me patient reports that he is feeling suicidal he would kill himself by "driving my car off a bridge". He cannot elaborate why he is suicidal patient is pleasant and cooperative.Marland Kitchen Results for orders placed during the hospital encounter of 08/08/12  CBC      Component Value Range   WBC 10.5  4.0 - 10.5 K/uL   RBC 4.95  4.22 - 5.81 MIL/uL   Hemoglobin 15.9  13.0 - 17.0 g/dL   HCT 11.9  14.7 - 82.9 %   MCV 94.3  78.0 - 100.0 fL   MCH 32.1  26.0 - 34.0 pg   MCHC 34.0  30.0 - 36.0 g/dL   RDW 56.2  13.0 - 86.5 %   Platelets 281  150 - 400 K/uL  COMPREHENSIVE METABOLIC PANEL      Component Value Range   Sodium 137  135 - 145 mEq/L   Potassium 3.9  3.5 - 5.1 mEq/L   Chloride 100  96 - 112 mEq/L   CO2 26  19 - 32 mEq/L   Glucose, Bld 106 (*) 70 - 99 mg/dL   BUN 12  6 - 23 mg/dL   Creatinine, Ser 7.84  0.50 - 1.35 mg/dL   Calcium 9.4  8.4 - 69.6 mg/dL   Total Protein 7.9  6.0 - 8.3 g/dL   Albumin 4.4  3.5 - 5.2 g/dL   AST 32  0 - 37 U/L   ALT 21  0 - 53 U/L   Alkaline Phosphatase 74  39 - 117 U/L   Total Bilirubin 0.8  0.3 - 1.2 mg/dL   GFR calc non Af Amer >90  >90 mL/min   GFR calc Af Amer >90  >90 mL/min  ETHANOL      Component Value Range   Alcohol, Ethyl (B) <11  0 - 11 mg/dL  URINE RAPID DRUG SCREEN (HOSP PERFORMED)      Component Value Range   Opiates NONE DETECTED  NONE DETECTED   Cocaine POSITIVE (*) NONE DETECTED   Benzodiazepines POSITIVE (*) NONE DETECTED   Amphetamines NONE DETECTED  NONE DETECTED   Tetrahydrocannabinol POSITIVE (*) NONE DETECTED   Barbiturates NONE DETECTED  NONE DETECTED   Dg Chest 2 View  07/20/2012  *RADIOLOGY REPORT*  Clinical Data: Sharp chest pain, shortness of breath  CHEST - 2 VIEW  Comparison: None.  Findings: No active infiltrate or effusion is seen.  Mediastinal contours appear normal.  There is no evidence of pneumothorax or pneumomediastinum.  The heart is within normal limits in size.  No bony abnormality is seen.   IMPRESSION: No active lung disease.   Original Report Authenticated By: Juline Patch, M.D.      Doug Sou, MD 08/08/12 1645  Doug Sou, MD 08/08/12 2157

## 2012-08-08 NOTE — ED Provider Notes (Signed)
Inpatient treatment recommended.  Recc citalopram.  Celene Kras, MD 08/08/12 [phone]

## 2012-08-08 NOTE — ED Provider Notes (Signed)
History     CSN: [id number]  Arrival date & time 08/08/12  1203   First MD Initiated Contact with Patient 08/08/12 1325      Chief Complaint  Patient presents with  . Medical Clearance    (Consider location/radiation/quality/duration/timing/severity/associated sxs/prior treatment) HPI  26 year old male was brought to the ED due to suicidal ideation. History was obtained through patient and through mom who was at bedside. Patient reports he recently broke up with his fiance and subsequently lost his house.  He has been extremely depressed.  He does not normally use illicit drugs but has been drinking alcohol heavily, which leads to using cocaine, and taking benzo and weeds.  Last cocaine use 3 days ago, last alcohol last night.  Pt plan to drive his truck off the bridge 2 days ago but stopped because he did not want to injured his dog, which was with him.  He talked to his mom, who filed IVC paper to have pt evaluate here in ED.  Pt is a smoker.  Currently endorse active SI, but denies HI or hallucination.  Denies any other complaints.    Past Medical History  Diagnosis Date  . Varicose veins     History reviewed. No pertinent past surgical history.  History reviewed. No pertinent family history.  History  Substance Use Topics  . Smoking status: Current Every Day Smoker -- 0.5 packs/day    Types: Cigarettes  . Smokeless tobacco: Not on file  . Alcohol Use: Yes     very rarely      Review of Systems  All other systems reviewed and are negative.    Allergies  Pollen extract  Home Medications  No current outpatient prescriptions on file.  BP 142/86  Pulse 88  Temp 98.4 F (36.9 C) (Oral)  Resp 20  SpO2 100%  Physical Exam  Nursing note and vitals reviewed. Constitutional: He appears well-developed and well-nourished.       Awake, alert, nontoxic appearance, appears depressed  HENT:  Head: Atraumatic.  Mouth/Throat: Oropharynx is clear and moist.  Eyes:  Conjunctivae normal and EOM are normal. Pupils are equal, round, and reactive to light. Right eye exhibits no discharge. Left eye exhibits no discharge.  Neck: Normal range of motion. Neck supple.  Cardiovascular: Normal rate and regular rhythm.  Exam reveals no gallop and no friction rub.   No murmur heard. Pulmonary/Chest: Effort normal. No respiratory distress. He exhibits no tenderness.  Abdominal: Soft. There is no tenderness. There is no rebound.  Musculoskeletal: He exhibits no tenderness.       ROM appears intact, no obvious focal weakness  Neurological: He is alert.  Skin: Skin is warm and dry. No rash noted.  Psychiatric: His speech is normal. He is withdrawn. Thought content is not paranoid and not delusional. He exhibits a depressed mood. He expresses homicidal ideation. He expresses homicidal plans. He expresses no suicidal plans.    ED Course  Procedures (including critical care time)  Labs Reviewed  COMPREHENSIVE METABOLIC PANEL - Abnormal; Notable for the following:    Glucose, Bld 106 (*)     All other components within normal limits  URINE RAPID DRUG SCREEN (HOSP PERFORMED) - Abnormal; Notable for the following:    Cocaine POSITIVE (*)     Benzodiazepines POSITIVE (*)     Tetrahydrocannabinol POSITIVE (*)     All other components within normal limits  CBC  ETHANOL   Results for orders placed during the hospital encounter of 08/08/12  CBC      Component Value Range   WBC 10.5  4.0 - 10.5 K/uL   RBC 4.95  4.22 - 5.81 MIL/uL   Hemoglobin 15.9  13.0 - 17.0 g/dL   HCT 16.1  09.6 - 04.5 %   MCV 94.3  78.0 - 100.0 fL   MCH 32.1  26.0 - 34.0 pg   MCHC 34.0  30.0 - 36.0 g/dL   RDW 40.9  81.1 - 91.4 %   Platelets 281  150 - 400 K/uL  COMPREHENSIVE METABOLIC PANEL      Component Value Range   Sodium 137  135 - 145 mEq/L   Potassium 3.9  3.5 - 5.1 mEq/L   Chloride 100  96 - 112 mEq/L   CO2 26  19 - 32 mEq/L   Glucose, Bld 106 (*) 70 - 99 mg/dL   BUN 12  6 - 23  mg/dL   Creatinine, Ser 7.82  0.50 - 1.35 mg/dL   Calcium 9.4  8.4 - 95.6 mg/dL   Total Protein 7.9  6.0 - 8.3 g/dL   Albumin 4.4  3.5 - 5.2 g/dL   AST 32  0 - 37 U/L   ALT 21  0 - 53 U/L   Alkaline Phosphatase 74  39 - 117 U/L   Total Bilirubin 0.8  0.3 - 1.2 mg/dL   GFR calc non Af Amer >90  >90 mL/min   GFR calc Af Amer >90  >90 mL/min  ETHANOL      Component Value Range   Alcohol, Ethyl (B) <11  0 - 11 mg/dL  URINE RAPID DRUG SCREEN (HOSP PERFORMED)      Component Value Range   Opiates NONE DETECTED  NONE DETECTED   Cocaine POSITIVE (*) NONE DETECTED   Benzodiazepines POSITIVE (*) NONE DETECTED   Amphetamines NONE DETECTED  NONE DETECTED   Tetrahydrocannabinol POSITIVE (*) NONE DETECTED   Barbiturates NONE DETECTED  NONE DETECTED   Dg Chest 2 View  07/20/2012  *RADIOLOGY REPORT*  Clinical Data: Sharp chest pain, shortness of breath  CHEST - 2 VIEW  Comparison: None.  Findings: No active infiltrate or effusion is seen.  Mediastinal contours appear normal.  There is no evidence of pneumothorax or pneumomediastinum.  The heart is within normal limits in size.  No bony abnormality is seen.  IMPRESSION: No active lung disease.   Original Report Authenticated By: Juline Patch, M.D.     1. Suicidal ideation  MDM  Pt has SI with plan to drive his truck off bridge.  Pt otherwise without any acute distress.  UDS positive for cocaine, benzo, and THC.  Pt otherwise medically cleared.  Will order psych consult, and will fill psych holding order.    Discussed care with my attending, telepsych ordered.  BP 142/86  Pulse 88  Temp 98.4 F (36.9 C) (Oral)  Resp 20  SpO2 100%  Nursing notes reviewed and considered in documentation  Previous records reviewed and considered  All labs/vitals reviewed and considered  xrays reviewed and considered       Fayrene Helper, PA-C 08/08/12 1540

## 2012-08-08 NOTE — ED Notes (Signed)
Patient discharge to [facility] with written and verbal instructions.

## 2012-08-08 NOTE — ED Provider Notes (Signed)
Medical screening examination/treatment/procedure(s) were conducted as a shared visit with non-physician practitioner(s) and myself.  I personally evaluated the patient during the encounter  Doug Sou, MD 08/08/12 2158

## 2012-08-08 NOTE — ED Notes (Signed)
Pt brought in by [employer] with IVC papers due to SI. Pt suicidal with plan to drive truck off bridge. Pt attempted to drive truck off bridge 2 days ago but stopped self because his dog was with him. Pt in severe state of depression.

## 2012-08-08 NOTE — BH Assessment (Signed)
Assessment Note   Douglas Morgan is an 26 y.o. male. Pt brought by his mother to the ED due to suicidal ideations. History was obtained through patient and through mom who was at bedside. Patient reports he recently broke up with his fiance and she gave him back his engagement ring. Sts she also moved out of the home and 8 days later he subsequently lost his house. Pt had to find housing immediately. He has been extremely depressed over the past 2 weeks. Denies history of depression. Pt also increasingly anxious with associated panic attacks daily for 2 weeks since breaking up with his girlfriend.  He has been drinking alcohol heavily for to weeks. His last drink was 2 weeks ago. He told the examining EDP that his drinking led to using cocaine, benzo's and THC. It was also noted that his last cocaine use was 3 days ago and  last alcohol was 2 nights ago. Pt only admitted to alcohol and cocaine use during the actual [facility] assessment.  Pt continues to admit to suicidal thoughts with a plan to drive his truck off the bridge. Sts that 2 days ago he actually drove toward a bridge with intentions to kill himself but stopped because he did not want to injure his dog, which was with him. He talked to his mom, who filed IVC papesr to have pt evaluate here in ED.  Pt referred to [facility] as he continues to endorse active SI, but denies HI or AVH's.   Axis I: Depressive Disorder Nos, Anxiety Disorder Nos,  and Polysubstance Dependence Axis II: Deferred Axis III:  Past Medical History  Diagnosis Date  . Varicose veins    Axis IV: housing problems, other psychosocial or environmental problems, problems related to social environment and problems with access to health care services Axis V: 31-40 impairment in reality testing  Past Medical History:  Past Medical History  Diagnosis Date  . Varicose veins     No past surgical history on file.  Family History: No family history on file.  Social History:  reports  that he has been smoking Cigarettes.  He has been smoking about .5 packs per day. He does not have any smokeless tobacco history on file. He reports that he drinks alcohol. He reports that he uses illicit drugs (Marijuana and Cocaine).  Additional Social History:  Alcohol / Drug Use Pain Medications: SEEMAR Prescriptions: SEE MAR Over the Counter: SEE MAR Longest period of sobriety (when/how long): unk Negative Consequences of Use: Financial;Personal relationships Substance #1 Name of Substance 1: Alcohol-beer and liqour 1 - Age of First Use: "89 or 26 yrs old" 1 - Amount (size/oz): "(2) 20 oz beers and a 1/2 a gallon of liqour which was shared with a friend" 1 - Frequency: daily for the past 2 weeks 1 - Duration: on-going for the past 2 weeks 1 - Last Use / Amount: "2 days ago ";pt reports binging on liqour Substance #2 Name of Substance 2: Cocaine  (Pt admits to a history of use and current use) 2 - Age of First Use: 26 yrs old 2 - Amount (size/oz): "it was not alot" 2 - Frequency: 2-3x's in the past 2 weeks 2 - Duration: varies; pt reports using on and off in the past several yrs 2 - Last Use / Amount: pt reports using cocaine in the last 2-3x"s in last 2 days Substance #3 Name of Substance 3: THC 3 - Age of First Use: pt did not admit to  use; age of first use unk 3 - Amount (size/oz): pt did not admit to use; amt of use unk 3 - Frequency: pt did not admit to use; frequency unk 3 - Duration: pt did not admit to use; duration unk 3 - Last Use / Amount: pt did not admit to use; amt unk Substance #4 Name of Substance 4: Benzo's (UDS +) 4 - Age of First Use: pt did not admit to use; age of first use unk 4 - Amount (size/oz): pt did not admit to use; amt of use unk 4 - Frequency: pt did not admit to use; frequency unk 4 - Duration: pt did not admit to use; duration unk 4 - Last Use / Amount: pt did not admit to use; amt unk  CIWA:   COWS:    Allergies:  Allergies  Allergen  Reactions  . Pollen Extract     Home Medications:  (Not in a hospital admission)  OB/GYN Status:  No LMP for male patient.  General Assessment Data Location of Assessment: [facility] Living Arrangements: Alone Can pt return to current living arrangement?: Yes Admission Status: Voluntary Is patient capable of signing voluntary admission?: Yes Transfer from: Acute Hospital Referral Source: Self/Family/Friend     Risk to self Suicidal Ideation: Yes-Currently Present Suicidal Intent: Yes-Currently Present Is patient at risk for suicide?: Yes Suicidal Plan?: Yes-Currently Present Specify Current Suicidal Plan:  (run car off a bridge; attempted 08/06/12) Access to Means: Yes Specify Access to Suicidal Means:  (car and bridge) What has been your use of drugs/alcohol within the last 12 months?:  (Yes; Alcohol; UDS + for cocaine, THC, benzo's) Previous Attempts/Gestures: No How many times?:  (0) Other Self Harm Risks:  (n/a) Triggers for Past Attempts: Other (Comment) (no previous history of attmepts) Intentional Self Injurious Behavior: None Family Suicide History: No Recent stressful life event(s): Other (Comment);Loss (Comment);Conflict (Comment);Turmoil (Comment) (relationship issues and recent move) Persecutory voices/beliefs?: No Depression: Yes Depression Symptoms: Feeling angry/irritable;Feeling worthless/self pity;Guilt;Fatigue;Tearfulness;Insomnia;Despondent;Isolating;Loss of interest in usual pleasures Substance abuse history and/or treatment for substance abuse?: Yes Suicide prevention information given to non-admitted patients: Not applicable  Risk to Others Homicidal Ideation: No Thoughts of Harm to Others: No Current Homicidal Intent: No-Not Currently/Within Last 6 Months Current Homicidal Plan: No Access to Homicidal Means: No Identified Victim:  (n/a) History of harm to others?: No Assessment of Violence: None Noted Violent Behavior Description:  (pt calm and  cooperative) Does patient have access to weapons?: No Criminal Charges Pending?: No Does patient have a court date: No  Psychosis Hallucinations: None noted Delusions: None noted  Mental Status Report Appear/Hygiene: Disheveled Eye Contact: Good Motor Activity: Freedom of movement;Unremarkable Speech: Logical/coherent Level of Consciousness: Alert Mood: Depressed;Helpless;Sad Affect: Anxious;Appropriate to circumstance Anxiety Level: Panic Attacks Panic attack frequency:  (daily for the past 2 weeks) Most recent panic attack:  (today 08/08/12) Thought Processes: Coherent Judgement: Impaired Orientation: Person;Place;Time;Situation Obsessive Compulsive Thoughts/Behaviors: None  Cognitive Functioning Concentration: Decreased Memory: Recent Intact;Remote Intact IQ: Average Insight: Poor Impulse Control: Poor Appetite: Poor Weight Loss:  ("25 pounds in 1 week") Weight Gain:  (none reported) Sleep: Decreased Total Hours of Sleep:  (1 1/2 hrs per night for the past 2 weeks) Vegetative Symptoms: None  ADLScreening [facility] Assessment Services) Patient's cognitive ability adequate to safely complete daily activities?: Yes Patient able to express need for assistance with ADLs?: Yes Independently performs ADLs?: Yes (appropriate for developmental age)  Abuse/Neglect [facility]) Physical Abuse: Denies Verbal Abuse: Denies Sexual Abuse: Denies  Prior  Inpatient Therapy Prior Inpatient Therapy: No Prior Therapy Dates:  (n/a) Prior Therapy Facilty/Provider(s):  (n/a) Reason for Treatment:  (n/a)  Prior Outpatient Therapy Prior Outpatient Therapy: No Prior Therapy Dates:  (n/a) Prior Therapy Facilty/Provider(s):  (n/a) Reason for Treatment:  (n/a)  ADL Screening (condition at time of admission) Patient's cognitive ability adequate to safely complete daily activities?: Yes Patient able to express need for assistance with ADLs?: Yes Independently performs ADLs?: Yes (appropriate  for developmental age) Weakness of Legs: None Weakness of Arms/Hands: None  Home Assistive Devices/Equipment Home Assistive Devices/Equipment: None    Abuse/Neglect Assessment (Assessment to be complete while patient is alone) Physical Abuse: Denies Verbal Abuse: Denies Sexual Abuse: Denies Exploitation of patient/patient's resources: Denies Self-Neglect: Denies Values / Beliefs Cultural Requests During Hospitalization: None Spiritual Requests During Hospitalization: None   Advance Directives (For Healthcare) Advance Directive: Patient does not have advance directive Nutrition Screen- [facility] Adult/[facility] Patient's home diet: Regular  Additional Information 1:1 In Past 12 Months?: No CIRT Risk: No Elopement Risk: No Does patient have medical clearance?: Yes     Disposition:  Disposition Disposition of Patient: Referred to [facility] and pending review for a inpatient admission. )  On Site Evaluation by:   Reviewed with Physician:     Octaviano Batty 08/08/2012 8:31 PM

## 2012-08-08 NOTE — ED Notes (Signed)
pts mother took all belongings with her. Pt has no belongings in hospital.

## 2012-08-08 NOTE — Progress Notes (Signed)
CSW completed support paperwork with patient, faxed to [facility], and gave to pt RN. Pt RN to arrange transportation for patient to [facility] by [employer] due to IVC.   Marland KitchenCatha Gosselin, Theresia Majors  [phone] .08/08/2012 1109pm

## 2012-08-08 NOTE — ED Notes (Signed)
Pt changed into blue scrubs and wanded by security. Belongings in pts mothers possession. Pt given [place] sandwich with coke.

## 2012-08-09 ENCOUNTER — Encounter (HOSPITAL_COMMUNITY): Payer: Self-pay | Admitting: *Deleted

## 2012-08-09 ENCOUNTER — Inpatient Hospital Stay (HOSPITAL_COMMUNITY)
Admission: AD | Admit: 2012-08-09 | Discharge: 2012-08-12 | DRG: 897 | Disposition: A | Discharge status: Home / Self Care | Payer: Federal, State, Local not specified - Other | Source: Ambulatory Visit | Attending: Psychiatry | Admitting: Psychiatry

## 2012-08-09 DIAGNOSIS — F17200 Nicotine dependence, unspecified, uncomplicated: Secondary | ICD-10-CM | POA: Diagnosis present

## 2012-08-09 DIAGNOSIS — Z23 Encounter for immunization: Secondary | ICD-10-CM

## 2012-08-09 DIAGNOSIS — F3289 Other specified depressive episodes: Secondary | ICD-10-CM | POA: Diagnosis present

## 2012-08-09 DIAGNOSIS — F1010 Alcohol abuse, uncomplicated: Principal | ICD-10-CM | POA: Diagnosis present

## 2012-08-09 DIAGNOSIS — F411 Generalized anxiety disorder: Secondary | ICD-10-CM | POA: Diagnosis present

## 2012-08-09 DIAGNOSIS — F329 Major depressive disorder, single episode, unspecified: Secondary | ICD-10-CM

## 2012-08-09 DIAGNOSIS — F1410 Cocaine abuse, uncomplicated: Secondary | ICD-10-CM | POA: Diagnosis present

## 2012-08-09 DIAGNOSIS — F1210 Cannabis abuse, uncomplicated: Secondary | ICD-10-CM | POA: Diagnosis present

## 2012-08-09 DIAGNOSIS — IMO0002 Reserved for concepts with insufficient information to code with codable children: Secondary | ICD-10-CM

## 2012-08-09 LAB — TSH: TSH: 0.81 u[IU]/mL (ref 0.350–4.500)

## 2012-08-09 MED ORDER — ALUM & MAG HYDROXIDE-SIMETH 200-200-20 MG/5ML PO SUSP: 30.0000 mL | ORAL | Status: DC | PRN

## 2012-08-09 MED ORDER — CITALOPRAM HYDROBROMIDE 10 MG PO TABS
10.0000 mg | ORAL_TABLET | Freq: Every day | ORAL | Status: DC
  Administered 2012-08-09 – 2012-08-10 (×2): 10 mg via ORAL
  Filled 2012-08-09 (×6): qty 1

## 2012-08-09 MED ORDER — TRAZODONE HCL 50 MG PO TABS
50.0000 mg | ORAL_TABLET | Freq: Every evening | ORAL | Status: DC | PRN
  Administered 2012-08-09 – 2012-08-11 (×4): 50 mg via ORAL
  Filled 2012-08-09 (×11): qty 1

## 2012-08-09 MED ORDER — ACETAMINOPHEN 325 MG PO TABS
650.0000 mg | ORAL_TABLET | Freq: Four times a day (QID) | ORAL | Status: DC | PRN
  Administered 2012-08-11: 650 mg via ORAL

## 2012-08-09 MED ORDER — ENSURE COMPLETE PO LIQD
237.0000 mL | Freq: Three times a day (TID) | ORAL | Status: DC
  Administered 2012-08-09 – 2012-08-12 (×8): 237 mL via ORAL

## 2012-08-09 MED ORDER — MAGNESIUM HYDROXIDE 400 MG/5ML PO SUSP: 30.0000 mL | Freq: Every day | ORAL | Status: DC | PRN

## 2012-08-09 MED ORDER — NICOTINE 21 MG/24HR TD PT24
21.0000 mg | MEDICATED_PATCH | Freq: Every day | TRANSDERMAL | Status: DC
  Administered 2012-08-09 – 2012-08-12 (×4): 21 mg via TRANSDERMAL
  Filled 2012-08-09 (×3): qty 1
  Filled 2012-08-09: qty 14
  Filled 2012-08-09 (×2): qty 1

## 2012-08-09 MED ORDER — CHLORDIAZEPOXIDE HCL 25 MG PO CAPS
25.0000 mg | ORAL_CAPSULE | Freq: Three times a day (TID) | ORAL | Status: DC | PRN
  Administered 2012-08-09 – 2012-08-11 (×5): 25 mg via ORAL
  Filled 2012-08-09 (×5): qty 1
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
## 2012-08-09 NOTE — Progress Notes (Signed)
[facility] Adult Inpatient Family/Significant Other Suicide Prevention Education  Suicide Prevention Education:  Education Completed; Douglas Morgan - mother [phone]),  (name of family member/significant other) has been identified by the patient as the family member/significant other with whom the patient will be residing, and identified as the person(s) who will aid the patient in the event of a mental health crisis (suicidal ideations/suicide attempt).  With written consent from the patient, the family member/significant other has been provided the following suicide prevention education, prior to the and/or following the discharge of the patient.  The suicide prevention education provided includes the following:  Suicide risk factors  Suicide prevention and interventions  National Suicide Hotline telephone number  [facility] assessment telephone number  [facility] Emergency Assistance 911  [facility] and/or Residential Mobile Crisis Unit telephone number  Request made of family/significant other to:  Remove weapons (e.g., guns, rifles, knives), all items previously/currently identified as safety concern.    Remove drugs/medications (over-the-counter, prescriptions, illicit drugs), all items previously/currently identified as a safety concern.  The family member/significant other verbalizes understanding of the suicide prevention education information provided.  The family member/significant other agrees to remove the items of safety concern listed above.  Douglas Morgan 08/09/2012, 3:38 PM

## 2012-08-09 NOTE — Progress Notes (Signed)
D: Patient is cooperative with staff and peers. Patient's mood is depressed. He reported on self inventory sheet that his sleep is poor, energy level is low, and ability to pay attention is good. Patient rated depression an "8" and feelings of hopelessness an "8". Denies SI/HI/AVH and pain. Patient reported that a change he plans to make to take better care of self is to stay clean.   A: Support and encouragement provided to patient. Maintain 15 minute checks throughout work shift.   R: Patient receptive and remains safe on unit.
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
## 2012-08-09 NOTE — Progress Notes (Signed)
Nutrition Note  Reason: MST score of 3  Patient reported his appetite and intake are well since admission. He reported no PO intake for a couple days PTA and poor PO intake over the past 2 weeks resulting in a 20 lb weight loss. Patient appears thin.   Wt Readings from Last 10 Encounters:  08/09/12 162 lb (73.483 kg)  04/04/12 187 lb (84.823 kg)  *Patient's weight is down 25 lb from UBW.  BMI: 20 kg/m^2  Patient reported he would like to regain the weight back. I have encouraged the patient to have increased PO intake to promote weight gain.   Will order patient TID, provides 750 kcal and 27 grams of protein daily.   RD available for nutrition needs.   Iven Finn [facility] [phone]

## 2012-08-09 NOTE — [facility] Suicide Risk Assessment (Signed)
Suicide Risk Assessment  Admission Assessment     Nursing information obtained from:  Patient Demographic factors:  Male;Adolescent or young adult;Caucasian;Living alone Current Mental Status:   (Pt currently denies) Loss Factors:  Loss of significant relationship Historical Factors:  NA Risk Reduction Factors:  Sense of responsibility to family;Employed;Positive social support  CLINICAL FACTORS:   Depression:   Comorbid alcohol abuse/dependence  COGNITIVE FEATURES THAT CONTRIBUTE TO RISK: None    SUICIDE RISK:   Mild:  Suicidal ideation of limited frequency, intensity, duration, and specificity.  There are no identifiable plans, no associated intent, mild dysphoria and related symptoms, good self-control (both objective and subjective assessment), few other risk factors, and identifiable protective factors, including available and accessible social support.  PLAN OF CARE: Supportive approach/coping skills/relaspe prevention   Ashira Kirsten A 08/09/2012, 12:30 PM
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
## 2012-08-09 NOTE — Progress Notes (Signed)
Aftercare Planning Group: 08/09/2012 9:45 AM Pt did not attend d/c planning group on this date.  SW met with pt individually at this time.  Pt presents with flat affect and depressed mood.  Pt states that he came to the hospital due to a recent break up with his girlfriend.  Pt states that his job is stressful and caused problems in his relationship.  Pt states that he moved out to an apartment on his own in [place].  Pt states that he used for 4-5 days straight after the break up and decided he needed to come to get help before it got worse.  Pt states that he wants outpatient services for follow up.  No further needs voiced by pt at this time.    [facility] Group Notes:  (Counselor/Nursing/MHT/Case Management/Adjunct)  08/09/2012  1:15 PM  Type of Therapy:  Group Therapy  Participation Level:  Did Not Attend  Douglas Morgan, LCSWA 08/09/2012 10:35 AM

## 2012-08-09 NOTE — Tx Team (Signed)
Initial Interdisciplinary Treatment Plan  PATIENT STRENGTHS: (choose at least two) Average or above average intelligence Capable of independent living General fund of knowledge Special hobby/interest Supportive family/friends Work skills  PATIENT STRESSORS: Loss of significant relationship* Substance abuse Traumatic event   PROBLEM LIST: Problem List/Patient Goals Date to be addressed Date deferred Reason deferred Estimated date of resolution  Substance abuse d/t depression      Risk for self harm      Depression r/t break-up with fiancee                                           DISCHARGE CRITERIA:  Ability to meet basic life and health needs Improved stabilization in mood, thinking, and/or behavior Motivation to continue treatment in a less acute level of care Reduction of life-threatening or endangering symptoms to within safe limits Verbal commitment to aftercare and medication compliance Withdrawal symptoms are absent or subacute and managed without 24-hour nursing intervention  PRELIMINARY DISCHARGE PLAN: Attend aftercare/continuing care group Outpatient therapy Return to previous living arrangement Return to previous work or school arrangements  PATIENT/FAMIILY INVOLVEMENT: This treatment plan has been presented to and reviewed with the patient, Douglas Morgan, and/or family member.  The patient and family have been given the opportunity to ask questions and make suggestions.  Douglas Morgan 08/09/2012, 1:42 AM

## 2012-08-09 NOTE — Progress Notes (Signed)
[facility] Group Notes:  (Counselor/Nursing/MHT/Case Management/Adjunct)  08/09/2012 2:29 PM  Type of Therapy:  Psychoeducational Skills  Participation Level:  Minimal  Participation Quality:  Inattentive and Redirectable  Affect:  Blunted  Cognitive:  Appropriate and Oriented  Insight:  Limited  Engagement in Group:  Limited  Engagement in Therapy:  n/a  Modes of Intervention:  Activity, Education, Problem-solving, Socialization and Support  Summary of Progress/Problems: [place] attended Psychoeducational group that focused on using quality time with support systems/individuals to engage in healthy coping skills. [place] participated with encouragement in activity guessing about self and peers. [place] was quiet while group discussed who their support systems are, how they can spend positive quality time together as a way to strengthen the relationship and use coping skills. [place] was given a homework assignment to find two ways to improve his support systems and twenty activities he can do to spend quality time with his supports.    Wandra Scot 08/09/2012, 2:29 PM

## 2012-08-09 NOTE — Progress Notes (Signed)
[facility] MD Progress Note  08/09/2012 12:31 PM  Diagnosis:   Axis I: Depressive Disorder NOS, Rule out Substance dependence, Substance Abuse and Substance Induced Mood Disorder Axis II: Deferred Axis III:  Past Medical History  Diagnosis Date  . Varicose veins    Axis IV: problems with primary support group Axis V: 51-60 moderate symptoms  ADL's:  Intact  Sleep: Fair  Appetite:  Poor  Suicidal Ideation:  Plan:  None Intent:  None Means:  None Homicidal Ideation:  Plan:  None Intent:  None Means:  None  [place] has a past history of substance abuse. Since he got with his ex fiancee he has not been abusing. For the last two and a half years they have been together. They were living together. He claims that she went to [place] to visit family, when she came back she gave him the ring saying that it was over. She had a hard time dealing with it. He relapsed. He started having suicidal ideas.  He is employed with a [employer].  Mental Status Examination/Evaluation: Objective:  Appearance: Fairly Groomed  Patent attorney::  Fair  Speech:  Normal Rate  Volume:  Normal  Mood:  Anxious and Depressed  Affect:  Restricted  Thought Process:  Coherent, Intact, Linear and Logical  Orientation:  Full  Thought Content:  WDL  Suicidal Thoughts:  Ideas, no plans, no intent  Homicidal Thoughts:  No  Memory:  Immediate;   Fair Recent;   Fair  Judgement:  Fair  Insight:  Present  Psychomotor Activity:  Normal  Concentration:  Fair  Recall:  Fair  Akathisia:  No  Handed:  Right  AIMS (if indicated):     Assets:  Communication Skills Desire for Improvement Financial Resources/Insurance Physical Health Social Support Transportation Vocational/Educational  Sleep:  Number of Hours: 4    Vital Signs:Blood pressure 128/64, pulse 73, temperature 98.2 F (36.8 C), temperature source Oral, resp. rate 18, height 6\' 2"  (1.88 m), weight 73.483 kg (162 lb), SpO2 100.00%. Current  Medications: Current Facility-Administered Medications  Medication Dose Route Frequency Provider Last Rate Last Dose  . acetaminophen (TYLENOL) tablet 650 mg  650 mg Oral Q6H PRN Kerry Hough, PA      . alum & mag hydroxide-simeth (MAALOX/MYLANTA) 200-200-20 MG/5ML suspension 30 mL  30 mL Oral Q4H PRN Kerry Hough, PA      . magnesium hydroxide (MILK OF MAGNESIA) suspension 30 mL  30 mL Oral Daily PRN Kerry Hough, PA      . nicotine (NICODERM CQ - dosed in mg/24 hours) patch 21 mg  21 mg Transdermal Q0600 Kerry Hough, PA   21 mg at 08/09/12 1478  . traZODone (DESYREL) tablet 50 mg  50 mg Oral QHS,MR X 1 Kerry Hough, PA   50 mg at 08/09/12 0124   Facility-Administered Medications Ordered in Other Encounters  Medication Dose Route Frequency Provider Last Rate Last Dose  . DISCONTD: acetaminophen (TYLENOL) tablet 650 mg  650 mg Oral Q4H PRN Fayrene Helper, PA-C      . DISCONTD: alum & mag hydroxide-simeth (MAALOX/MYLANTA) 200-200-20 MG/5ML suspension 30 mL  30 mL Oral PRN Fayrene Helper, PA-C      . DISCONTD: citalopram (CELEXA) tablet 20 mg  20 mg Oral Daily Celene Kras, MD   20 mg at 08/08/12 2008  . DISCONTD: ibuprofen (ADVIL,MOTRIN) tablet 600 mg  600 mg Oral Q8H PRN Fayrene Helper, PA-C      . DISCONTD: LORazepam (  ATIVAN) tablet 1 mg  1 mg Oral Q8H PRN Fayrene Helper, PA-C      . DISCONTD: nicotine (NICODERM CQ - dosed in mg/24 hours) patch 21 mg  21 mg Transdermal Daily Fayrene Helper, PA-C      . DISCONTD: ondansetron (ZOFRAN) tablet 4 mg  4 mg Oral Q8H PRN Fayrene Helper, PA-C      . DISCONTD: zolpidem (AMBIEN) tablet 5 mg  5 mg Oral QHS PRN Fayrene Helper, PA-C        Lab Results:  Results for orders placed during the hospital encounter of 08/09/12 (from the past 48 hour(s))  TSH     Status: Normal   Collection Time   08/09/12  6:20 AM      Component Value Range Comment   TSH 0.810  0.350 - 4.500 uIU/mL     Physical Findings: AIMS: Facial and Oral Movements Muscles of Facial Expression:  None, normal Lips and Perioral Area: None, normal Jaw: None, normal Tongue: None, normal,Extremity Movements Upper (arms, wrists, hands, fingers): None, normal Lower (legs, knees, ankles, toes): None, normal, Trunk Movements Neck, shoulders, hips: None, normal, Overall Severity Severity of abnormal movements (highest score from questions above): None, normal Incapacitation due to abnormal movements: None, normal Patient's awareness of abnormal movements (rate only patient's report): No Awareness, Dental Status Current problems with teeth and/or dentures?: No Does patient usually wear dentures?: No  CIWA:  CIWA-Ar Total: 4  COWS:  COWS Total Score: 3   Treatment Plan Summary: Daily contact with patient to assess and evaluate symptoms and progress in treatment Medication management  Plan: Supportive approach/coping skills/relapse prevention  Manoj Enriquez A 08/09/2012, 12:31 PM

## 2012-08-10 DIAGNOSIS — F4321 Adjustment disorder with depressed mood: Secondary | ICD-10-CM

## 2012-08-10 DIAGNOSIS — F1010 Alcohol abuse, uncomplicated: Principal | ICD-10-CM

## 2012-08-10 MED ORDER — CITALOPRAM HYDROBROMIDE 20 MG PO TABS
20.0000 mg | ORAL_TABLET | Freq: Every day | ORAL | Status: DC
  Administered 2012-08-11 – 2012-08-12 (×2): 20 mg via ORAL
  Filled 2012-08-10 (×4): qty 1
  Filled 2012-08-10: qty 3

## 2012-08-10 NOTE — Progress Notes (Signed)
D: Patient appropriate and cooperative with staff and peers. Patient reported on self inventory sheet that his sleep is fair, energy level is normal, and ability to pay attention is good. He rated depression between a "6" or "7" and feelings of hopelessness a "4". Patient c/o anxiety. Patient reported that a change he plan to make to take better care of self is not to drink.  A: Support and encouragement provided to patient. Scheduled medications administered per MD orders. PRN Librium given to patient for anxiety. 15 minute checks monitored throughout work shift.  R: Patient receptive. Denies SI/HI/AVH and pain. Patient remains safe.

## 2012-08-10 NOTE — Progress Notes (Signed)
Patient ID: Douglas Morgan, male   DOB: 1986-02-07, 26 y.o.   MRN: [id number]  Problem: Depressive Disorder, Substance Abuse  D: Patient c/o depression and anxiety but is interacting well in milieu with peers.  A: Monitor patient Q 15 minutes for safety, encourage staff/peer interaction and group attendance, administer medications as ordered by MD.  R: Patient compliant with medications and participated in group session. No distress noted.

## 2012-08-10 NOTE — Progress Notes (Signed)
D: Patient denies SI/HI/AVH. Patient rates hopelessness as 6,  depression as 9, and anxiety as 9.  Patient affect is flat. Mood is depressed.  Pt states "I miss my life. I miss my fiance. I'm depressed thinking about everything."  Patient did attend evening group. Patient visible on the milieu. No distress noted. A: Support and encouragement offered. Scheduled medications given to pt. Q 15 min checks continued for patient safety. R: Patient receptive. Patient remains safe on the unit.

## 2012-08-10 NOTE — Progress Notes (Signed)
Patient resting quietly with eyes closed. Respirations even and unlabored. No distress noted. Q 15 minute check continues to maintain safety   

## 2012-08-10 NOTE — Progress Notes (Signed)
daily Aftercare Planning Group: 08/10/2012 8:45 AM  Pt attended discharge planning group and actively participated in group.  SW provided pt with today's workbook.  Pt presents with flat affect.  Mood appears to be depressed  AEB poor eye contact, poorly engaged with conversation of the group and body language.  When asked patient reporting anxiety is at a 7 and depression is at a 6. Patient also reports he is very tired but he declines any SI/HI, plan or intent.  When discussing his aftercare plan at [place] patient reports he would like to do outpatient in the community and agreeable to a [facility] referral for community mental health.  Patient declines any other needs at this time.  [facility] Group Notes:  (Counselor/Nursing/MHT/Case Management/Adjunct)  08/10/2012 1:15 PM  Type of Therapy:  Group Therapy Processing group: Emotion Regulation Topic   Participation Level:  Active  Participation Quality:  Appropriate  Affect:  Anxious and Appropriate  Cognitive:  Alert, Appropriate and Oriented  Insight:  Good, Patient was able to role play and explain his situation with regards to his emotions and how he would respond  Engagement in Group:  Good  Engagement in Therapy:  Good  Modes of Intervention:  Problem-solving, Role-play, Socialization and Support  Summary of Progress/Problems:  [place] was very engaged when talking about his emotions and what emotional regulation means to him.  [place] was able to role play different situations with this Clinical research associate and explain how gender plays a difference in how one responds automatically to emotions.  [place] was able through his responses to gain other members in group responses in which some were the same and some were different.  [place] was able to be insightful, make good eye contact, and articulate his thoughts and emotions in an appropriate manner.     Zyquan Crotty Nail, LCSW 08/10/2012 11:07 AM

## 2012-08-10 NOTE — Progress Notes (Signed)
[facility] MD Progress Note  08/10/2012 2:42 PM  Diagnosis:   Axis I: Adjustment Disorder with Depressed Mood, Alcohol Abuse and Depressive Disorder NOS Axis II: Deferred Axis III:  Past Medical History  Diagnosis Date  . Varicose veins    Axis IV: problems with primary support group Axis V: 51-60 moderate symptoms  ADL's:  Intact  Sleep: Fair  Appetite:  Fair  Suicidal Ideation:  Plan:  None Intent:  None Means:  None Homicidal Ideation:  Plan:  None Intent:  None Means:  None  Douglas Morgan had a difficult time last evening after he called his ex fiance and got the impression she has moved on, as well as hanging the phone on him. He loves her and still wants to be with her but realizes he cant make her love him back. Wants to deal with these feelings, be involved in therapy, not go back to drinking as way of coping. Afraid that alcohol leads to some other drugs.  Mental Status Examination/Evaluation: Objective:  Appearance: Fairly Groomed  Patent attorney::  Fair  Speech:  Clear and Coherent  Volume:  Normal  Mood:  Depressed  Affect:  Appropriate  Thought Process:  Coherent, Goal Directed, Intact and Logical  Orientation:  Full  Thought Content:  WDL  Suicidal Thoughts:  No  Homicidal Thoughts:  No  Memory:  Immediate;   Fair Recent;   Fair Remote;   Fair  Judgement:  Fair  Insight:  Fair  Psychomotor Activity:  Normal  Concentration:  Fair  Recall:  Fair  Akathisia:  No  Handed:  Right  AIMS (if indicated):     Assets:  Communication Skills Desire for Improvement Housing Physical Health Social Support Talents/Skills Vocational/Educational  Sleep:  Number of Hours: 5    Vital Signs:Blood pressure 120/80, pulse 80, temperature 98 F (36.7 C), temperature source Oral, resp. rate 16, height 6\' 2"  (1.88 m), weight 73.483 kg (162 lb), SpO2 100.00%. Current Medications: Current Facility-Administered Medications  Medication Dose Route Frequency Provider Last Rate Last  Dose  . acetaminophen (TYLENOL) tablet 650 mg  650 mg Oral Q6H PRN Kerry Hough, PA      . alum & mag hydroxide-simeth (MAALOX/MYLANTA) 200-200-20 MG/5ML suspension 30 mL  30 mL Oral Q4H PRN Kerry Hough, PA      . chlordiazePOXIDE (LIBRIUM) capsule 25 mg  25 mg Oral TID PRN Verne Spurr, PA-C   25 mg at 08/09/12 1933  . citalopram (CELEXA) tablet 10 mg  10 mg Oral Daily Verne Spurr, PA-C   10 mg at 08/10/12 0805  . feeding supplement (ENSURE COMPLETE) liquid 237 mL  237 mL Oral TID BM Anastasio Champion, RD   237 mL at 08/10/12 1057  . magnesium hydroxide (MILK OF MAGNESIA) suspension 30 mL  30 mL Oral Daily PRN Kerry Hough, PA      . nicotine (NICODERM CQ - dosed in mg/24 hours) patch 21 mg  21 mg Transdermal Q0600 Kerry Hough, PA   21 mg at 08/10/12 9147  . traZODone (DESYREL) tablet 50 mg  50 mg Oral QHS,MR X 1 Kerry Hough, PA   50 mg at 08/09/12 2210    Lab Results:  Results for orders placed during the hospital encounter of 08/09/12 (from the past 48 hour(s))  TSH     Status: Normal   Collection Time   08/09/12  6:20 AM      Component Value Range Comment   TSH 0.810  0.350 -  4.500 uIU/mL     Physical Findings: AIMS: Facial and Oral Movements Muscles of Facial Expression: None, normal Lips and Perioral Area: None, normal Jaw: None, normal Tongue: None, normal,Extremity Movements Upper (arms, wrists, hands, fingers): None, normal Lower (legs, knees, ankles, toes): None, normal, Trunk Movements Neck, shoulders, hips: None, normal, Overall Severity Severity of abnormal movements (highest score from questions above): None, normal Incapacitation due to abnormal movements: None, normal Patient's awareness of abnormal movements (rate only patient's report): No Awareness, Dental Status Current problems with teeth and/or dentures?: No Does patient usually wear dentures?: No  CIWA:  CIWA-Ar Total: 4  COWS:  COWS Total Score: 3   Treatment Plan Summary: Daily  contact with patient to assess and evaluate symptoms and progress in treatment Medication management  Plan: Will start an SSRI due to persistent anxiety, worry, underlying depressed mood. Will work on coping skills/relapse prevention  Wylie Russon A 08/10/2012, 2:42 PM

## 2012-08-11 DIAGNOSIS — F411 Generalized anxiety disorder: Secondary | ICD-10-CM

## 2012-08-11 MED ORDER — HYDROXYZINE HCL 50 MG PO TABS
50.0000 mg | ORAL_TABLET | Freq: Four times a day (QID) | ORAL | Status: DC | PRN
  Administered 2012-08-11 – 2012-08-12 (×2): 50 mg via ORAL

## 2012-08-11 NOTE — Social Work (Signed)
Aftercare Planning Group: 08/11/2012 9:45 AM  Pt attended discharge planning group and actively participated in group.  SW provided pt with today's workbook.  Pt presents with calm mood and affect.  Pt rates depression at a 3-4 and anxiety at a 5-6 today.  Pt denies SI/HI.  Pt reports feeling stable to d/c soon.  Pt will return home and has follow up scheduled at [facility] for medication management and therapy.  No further needs voiced by pt at this time.  Safety planning and suicide prevention discussed.  Pt participated in discussion and acknowledged an understanding of the information provided.         [facility] Group Notes:  (Counselor/Nursing/MHT/Case Management/Adjunct)  08/11/2012  1:15 PM  Type of Therapy:  Group Therapy  Participation Level:  Active  Participation Quality:  Appropriate and Attentive  Affect:  Depressed and Flat  Cognitive:  Alert and Appropriate  Insight:  Good  Engagement in Group:  Good  Engagement in Therapy:  Good  Modes of Intervention:  Socialization and Support  Summary of Progress/Problems: The topic for group was balance in life.  Pt participated in the discussion about when their life was in balance and out of balance and how this feels.  Pt discussed ways to get back in balance and short term goals they can work on to get where they want to be.  Pt shared how his recent break up through him into a depression and is trying to figure out how to overcome this still.     Douglas Morgan, LCSWA 08/11/2012 9:53 AM

## 2012-08-11 NOTE — Progress Notes (Signed)
Psychoeducational Group Note  Date:  08/11/2012 Time:  1100  Group Topic/Focus:  Rediscovering Joy:   The focus of this group is to explore various ways to relieve stress in a positive manner.  Participation Level:  Active  Participation Quality:  Appropriate, Attentive and Sharing  Affect:  Appropriate  Cognitive:  Alert and Appropriate  Insight:  Good  Engagement in Group:  Good  Additional Comments:   Pt has been observed interacting with his peers. He shared that a joyful time for him was when his 62 year old niece recovered from open-heart surgery. Pt shared funny movies that make him laugh and accepted a journal to record things that make him joyful and happy.  Gwyndolyn Kaufman 08/11/2012, 1:32 PM

## 2012-08-11 NOTE — Progress Notes (Signed)
D: Patient appropriate and cooperative with staff and peers. Anxious at times, brightens on approach. Patient reported on self inventory sheet that his sleep is fair, energy level is normal and ability to pay attention is good. He rated depression between 6-7 and feelings of hopelessness 3-4. Patient reported that changes he plans to make to better care for self are to work out and hang out with better people.  A: Support and encouragement provided to patient. Administered scheduled medications per MD orders. Maintain 15 minute checks throughout the work day.  R: Patient receptive. Patient remains safe. Denies SI/HI/AVH and pain.

## 2012-08-11 NOTE — Progress Notes (Signed)
Psychoeducational Group Note  Date:  08/11/2012 Time:  2000  Group Topic/Focus:  Karaoke   Participation Level:  Active  Participation Quality:  Appropriate  Affect:  Appropriate  Cognitive:  Appropriate  Insight:  Good  Engagement in Group:  Good  Additional Comments:    Johanna Matto A 08/11/2012, 10:15 PM.

## 2012-08-11 NOTE — Progress Notes (Signed)
Psychoeducational Group Note  Date:  08/10/2012  Time:  2000  Group Topic/Focus:  AA group  Participation Level:  Active  Participation Quality:  Appropriate  Affect:  Anxious and Appropriate  Cognitive:  Alert  Insight:  Good  Engagement in Group:  Good  Additional Comments:    Fed Ceci R 08/11/2012, 12:51 AM

## 2012-08-11 NOTE — Progress Notes (Signed)
[facility] MD Progress Note  08/11/2012 7:30 PM  Diagnosis:   Axis I: Alcohol Abuse and Depressive Disorder NOS Axis II: Deferred Axis III:  Past Medical History  Diagnosis Date  . Varicose veins    Axis IV: problems with primary support group Axis V: 51-60 moderate symptoms  ADL's:  Intact  Sleep: Fair  Appetite:  Fair  Suicidal Ideation:  Plan:  None Intent:  None Means:  None Homicidal Ideation:  Plan:  None Intent:  None Means:  None  [place] has continued to deal with the loss of the relationship. He wants to get himself together, be able to move forward. He has not called her, and plans not to do so. He does experience some episodic anxiety, would like to have something he can try for it.  Mental Status Examination/Evaluation: Objective:  Appearance: Fairly Groomed  Eye Contact::  Good  Speech:  Clear and Coherent  Volume:  Normal  Mood:  Worried, anxious  Affect:  Appropriate  Thought Process:  Coherent, Goal Directed, Linear and Logical  Orientation:  Full  Thought Content:  WDL  Suicidal Thoughts:  No  Homicidal Thoughts:  No  Memory:  Immediate;   Fair Recent;   Fair Remote;   Fair  Judgement:  Fair  Insight:  Present  Psychomotor Activity:  Normal  Concentration:  Fair  Recall:  Fair  Akathisia:  No  Handed:  Right  AIMS (if indicated):     Assets:  Communication Skills Desire for Improvement Financial Resources/Insurance Social Support Vocational/Educational  Sleep:  Number of Hours: 6.75    Vital Signs:Blood pressure 132/83, pulse 85, temperature 98 F (36.7 C), temperature source Oral, resp. rate 16, height 6\' 2"  (1.88 m), weight 73.483 kg (162 lb), SpO2 100.00%. Current Medications: Current Facility-Administered Medications  Medication Dose Route Frequency Provider Last Rate Last Dose  . acetaminophen (TYLENOL) tablet 650 mg  650 mg Oral Q6H PRN Kerry Hough, PA   650 mg at 08/11/12 0636  . alum & mag hydroxide-simeth (MAALOX/MYLANTA)  200-200-20 MG/5ML suspension 30 mL  30 mL Oral Q4H PRN Kerry Hough, PA      . chlordiazePOXIDE (LIBRIUM) capsule 25 mg  25 mg Oral TID PRN Verne Spurr, PA-C   25 mg at 08/11/12 1438  . citalopram (CELEXA) tablet 20 mg  20 mg Oral Daily Rachael Fee, MD   20 mg at 08/11/12 1438  . feeding supplement (ENSURE COMPLETE) liquid 237 mL  237 mL Oral TID BM Anastasio Champion, RD   237 mL at 08/11/12 1702  . hydrOXYzine (ATARAX/VISTARIL) tablet 50 mg  50 mg Oral Q6H PRN Rachael Fee, MD   50 mg at 08/11/12 1822  . magnesium hydroxide (MILK OF MAGNESIA) suspension 30 mL  30 mL Oral Daily PRN Kerry Hough, PA      . nicotine (NICODERM CQ - dosed in mg/24 hours) patch 21 mg  21 mg Transdermal Q0600 Kerry Hough, PA   21 mg at 08/11/12 0636  . traZODone (DESYREL) tablet 50 mg  50 mg Oral QHS,MR X 1 Kerry Hough, PA   50 mg at 08/10/12 2104    Lab Results: No results found for this or any previous visit (from the past 48 hour(s)).  Physical Findings: AIMS: Facial and Oral Movements Muscles of Facial Expression: None, normal Lips and Perioral Area: None, normal Jaw: None, normal Tongue: None, normal,Extremity Movements Upper (arms, wrists, hands, fingers): None, normal Lower (legs, knees, ankles, toes):  None, normal, Trunk Movements Neck, shoulders, hips: None, normal, Overall Severity Severity of abnormal movements (highest score from questions above): None, normal Incapacitation due to abnormal movements: None, normal Patient's awareness of abnormal movements (rate only patient's report): No Awareness, Dental Status Current problems with teeth and/or dentures?: No Does patient usually wear dentures?: No  CIWA:  CIWA-Ar Total: 1  COWS:  COWS Total Score: 1   Treatment Plan Summary: Daily contact with patient to assess and evaluate symptoms and progress in treatment Medication management  Plan: Supportive approach/coping skills/relapse prevention            Trial with Vistaril 50  mg every 6 hours PRN anxiety Julis Haubner A 08/11/2012, 7:30 PM

## 2012-08-11 NOTE — Progress Notes (Signed)
Patient requested prn librium for c/o of anxiety/tremors. Patient received librium 25 mg prn. Patient B/P 136/75 P 73 Sitting. B/P 132/83 P 85. Patient denies having hx of hypertension.

## 2012-08-11 NOTE — Progress Notes (Signed)
D: Patient appropriate and interacting in the milieu and attending various activities.Pt has complained of anxiety today and requested and received new PRN medication, vistaril, to help with anxiety. Pt had visitors this evening and does not verbalize any further complaints A: Support and encouragement provided throughout the evening R: Will continue to monitor.

## 2012-08-11 NOTE — Progress Notes (Signed)
[facility] MD Progress Note  08/11/2012 12:52 PM  Diagnosis:   Axis I: Depressive Disorder NOS and Substance Abuse Axis II: Deferred Axis III:  Past Medical History  Diagnosis Date  . Varicose veins    Axis IV: problems with primary support group Axis V: 61-70 mild symptoms  ADL's:  Intact  Sleep: Good  Appetite:  Good  Suicidal Ideation:  Plan:  Denies Intent:  Denies Means:  Denies Homicidal Ideation:  Plan:  Denies Intent:  Denies Means:  Denies  Doing much better. Has had couple of episodes in which he was wanting to talk to his ex fiancee, but has convinced himself that it was going to make him feel worst. He is doing better with the idea he has to let her go and move on.  Mental Status Examination/Evaluation: Objective:  Appearance: Casual and Fairly Groomed  Eye Contact::  Good  Speech:  Normal Rate  Volume:  Normal  Mood:  Euthymic  Affect:  Appropriate  Thought Process:  Coherent, Goal Directed, Intact and Logical  Orientation:  Full  Thought Content:  WDL  Suicidal Thoughts:  No  Homicidal Thoughts:  No  Memory:  Immediate;   Fair Recent;   Fair Remote;   Fair  Judgement:  Intact  Insight:  Present  Psychomotor Activity:  Normal  Concentration:  Fair  Recall:  Fair  Akathisia:  No  Handed:  Right  AIMS (if indicated):     Assets:  Communication Skills Desire for Improvement Physical Health Social Support Transportation Vocational/Educational  Sleep:  Number of Hours: 6.75    Vital Signs:Blood pressure 153/91, pulse 59, temperature 98 F (36.7 C), temperature source Oral, resp. rate 16, height 6\' 2"  (1.88 m), weight 73.483 kg (162 lb), SpO2 100.00%. Current Medications: Current Facility-Administered Medications  Medication Dose Route Frequency Provider Last Rate Last Dose  . acetaminophen (TYLENOL) tablet 650 mg  650 mg Oral Q6H PRN Kerry Hough, PA   650 mg at 08/11/12 0636  . alum & mag hydroxide-simeth (MAALOX/MYLANTA) 200-200-20 MG/5ML  suspension 30 mL  30 mL Oral Q4H PRN Kerry Hough, PA      . chlordiazePOXIDE (LIBRIUM) capsule 25 mg  25 mg Oral TID PRN Verne Spurr, PA-C   25 mg at 08/11/12 5409  . citalopram (CELEXA) tablet 20 mg  20 mg Oral Daily Rachael Fee, MD      . feeding supplement (ENSURE COMPLETE) liquid 237 mL  237 mL Oral TID BM Anastasio Champion, RD   237 mL at 08/11/12 1057  . magnesium hydroxide (MILK OF MAGNESIA) suspension 30 mL  30 mL Oral Daily PRN Kerry Hough, PA      . nicotine (NICODERM CQ - dosed in mg/24 hours) patch 21 mg  21 mg Transdermal Q0600 Kerry Hough, PA   21 mg at 08/11/12 0636  . traZODone (DESYREL) tablet 50 mg  50 mg Oral QHS,MR X 1 Kerry Hough, PA   50 mg at 08/10/12 2104  . DISCONTD: citalopram (CELEXA) tablet 10 mg  10 mg Oral Daily Verne Spurr, PA-C   10 mg at 08/10/12 8119    Lab Results: No results found for this or any previous visit (from the past 48 hour(s)).  Physical Findings: AIMS: Facial and Oral Movements Muscles of Facial Expression: None, normal Lips and Perioral Area: None, normal Jaw: None, normal Tongue: None, normal,Extremity Movements Upper (arms, wrists, hands, fingers): None, normal Lower (legs, knees, ankles, toes): None, normal, Trunk Movements  Neck, shoulders, hips: None, normal, Overall Severity Severity of abnormal movements (highest score from questions above): None, normal Incapacitation due to abnormal movements: None, normal Patient's awareness of abnormal movements (rate only patient's report): No Awareness, Dental Status Current problems with teeth and/or dentures?: No Does patient usually wear dentures?: No  CIWA:  CIWA-Ar Total: 1  COWS:  COWS Total Score: 1   Treatment Plan Summary: Daily contact with patient to assess and evaluate symptoms and progress in treatment Medication management  Plan: Continue to work on coping skills/help process the loss of the relationship           Relapse prevention  Kimiya Brunelle  A 08/11/2012, 12:52 PM

## 2012-08-12 MED ORDER — CITALOPRAM HYDROBROMIDE 20 MG PO TABS: 20.0000 mg | ORAL_TABLET | Freq: Every day | ORAL | Status: DC

## 2012-08-12 MED ORDER — HYDROXYZINE PAMOATE 50 MG PO CAPS: 50.0000 mg | ORAL_CAPSULE | Freq: Two times a day (BID) | ORAL | Status: DC | PRN

## 2012-08-12 NOTE — Progress Notes (Signed)
Patient ID: Douglas Morgan, male   DOB: 1986/03/03, 26 y.o.   MRN: [id number]  D: Patient lying in bed with eyes closed. Appears to be sleeping. Respirations even and non-labored. A: Staff will monitor on q 15 minute checks and follow treatment plan and give medications as ordered. R: No patient response due to sleeping.

## 2012-08-12 NOTE — Discharge Summary (Signed)
Physician Discharge Summary Note  Patient:  Douglas Morgan is an 26 y.o., male MRN:  [id number] DOB:  [birth year] Patient phone:  [phone] (home)  Patient address:   [address] [place] [address]   Date of Admission:  08/09/2012 Date of Discharge: 08/12/2012 Discharge Diagnoses: Principal Problem:  *Substance abuse, episodic/binge Active Problems:  Depressive disorder, not elsewhere classified  Axis Diagnosis:   Discharge Diagnoses:  AXIS I: Alcohol Abuse, Anxiety Disorder NOS and Depressive Disorder NOS  AXIS II: Deferred  AXIS III:  Past Medical History   Diagnosis  Date   .  Varicose veins     AXIS IV: problems with primary support group  AXIS V: 61-70 mild symptoms Level of Care:  OP  Hospital Course:   Thomasene Lot was admitted for detox from alcohol, cocaine,and crisis management.  He was treated with the standard Librium protocol.  Medical problems were identified and treated.  Home medication was restarted as appropriate.     Improvement was monitored by CIWA/COWS scores and patient's daily report of withdrawal symptom reduction. Emotional and mental status was monitored by daily self inventory reports completed by the patient and clinical staff.      The patient was evaluated by the treatment team for stability and plans for continued recovery upon discharge. He was offered further treatment options upon discharge including Residential, IOP, and Outpatient treatment.  The patient's motivation was an integral factor for scheduling further treatment.  Employment, transportation, bed availability, health status, family support, and any pending legal issues were also considered.  He will be returning home with a plan to follow up with [facility]. DISCHARGE DESTINATION:  Follow up with [facility]. On 08/17/2012. (Arrive at 8:00 am on this date!)    Contact information:   [address], [place] [address] [phone]  Upon discharge, patient adamantly denies  suicidal, homicidal ideations, auditory, visual hallucinations and or delusional thinking. They left [facility] with all personal belongings via personal transportation in no apparent distress.  Consults: None  Significant Diagnostic Studies:  None  Discharge Vitals:   Blood pressure 132/87, pulse 88, temperature 98.1 F (36.7 C), temperature source Oral, resp. rate 18, height 6\' 2"  (1.88 m), weight 73.483 kg (162 lb), SpO2 100.00%..  Mental Status Exam: See Mental Status Examination and Suicide Risk Assessment completed by Attending Physician prior to discharge.  Discharge destination:  Home  Is patient on multiple antipsychotic therapies at discharge:  No  Has Patient had three or more failed trials of antipsychotic monotherapy by history: N/A Recommended Plan for Multiple Antipsychotic Therapies: N/A  Medication list: Patient declined all medications at discharge. Follow-up recommendations:   Activities: Resume typical activities Diet: Resume typical diet Tests: none Other: Follow up with outpatient provider and report any side effects to out patient prescriber.  Comments:  Take all your medications as prescribed by your mental healthcare provider. Report any adverse effects and or reactions from your medicines to your outpatient provider promptly. Patient is instructed and cautioned to not engage in alcohol and or illegal drug use while on prescription medicines. In the event of worsening symptoms, patient is instructed to call the crisis hotline, 911 and or go to the nearest ED for appropriate evaluation and treatment of symptoms.  Signed:  Rona Ravens. Laymon Stockert PAC 08/12/2012 10:12 AM

## 2012-08-12 NOTE — Tx Team (Signed)
Interdisciplinary Treatment Plan Update (Adult)  Date:  08/12/2012  Time Reviewed:  9:44 AM   Progress in Treatment: Attending groups: Yes Participating in groups:  Yes Taking medication as prescribed: Yes Tolerating medication:  Yes Family/Significant othe contact made:  Yes, contact made with mother Patient understands diagnosis:  Yes Discussing patient identified problems/goals with staff:  Yes Medical problems stabilized or resolved:  Yes Denies suicidal/homicidal ideation: Yes Issues/concerns per patient self-inventory:  None identified Other: N/A  New problem(s) identified: None Identified  Reason for Continuation of Hospitalization: Stable to d/c  Interventions implemented related to continuation of hospitalization: Stable to d/c  Additional comments: N/A  Estimated length of stay: D/C today  Discharge Plan: Pt will follow up with [facility] for medication management and therapy.    New goal(s): N/A  Review of initial/current patient goals per problem list:    1.  Goal(s): Address substance use  Met:  Yes  Target date: by discharge  As evidenced by: completed detox protocol and referred to appropriate treatment  2.  Goal (s): Reduce depressive and anxiety symptoms  Met:  Yes  Target date: by discharge  As evidenced by: Reducing depression from a 10 to a 3 as reported by pt.  Pt rates depression at a 0 and anxiety at a 2 today.   3.  Goal(s): Eliminate SI  Met:  Yes  Target date: by discharge  As evidenced by: Pt denies SI.    Attendees: Patient:  Douglas Morgan  08/12/2012 9:44 AM   Family:     Physician:  Geoffery Lyons, MD 08/12/2012 9:44 AM   Nursing: Alease Frame, RN 08/12/2012 9:44 AM   Clinical Social Worker:  Reyes Ivan, LCSWA 08/12/2012 9:44 AM   Other: Stephannie Li, RN 08/12/2012 9:44 AM   Other:  Ashley Jacobs, LCSW 08/12/2012 9:45 AM   Other:     Other:     Other:      Scribe for Treatment Team:   Reyes Ivan 08/12/2012 9:44  AM

## 2012-08-12 NOTE — [facility] Suicide Risk Assessment (Signed)
Suicide Risk Assessment  Discharge Assessment     Demographic Factors:  Male, Adolescent or young adult and Caucasian  Mental Status Per Nursing Assessment::   On Admission:   (Pt currently denies)  Current Mental Status by Physician: No suicidal ideas, plans or intent  Loss Factors: Loss of significant relationship  Historical Factors: NA  Risk Reduction Factors:   Sense of responsibility to family, Employed, Living with another person, especially a relative, Positive social support, Positive therapeutic relationship and Positive coping skills or problem solving skills  Continued Clinical Symptoms: Depression, anxiety have improved. Has a relapse prevention plan in place.   Cognitive Features That Contribute To Risk: None  Suicide Risk:  Minimal  Discharge Diagnoses:   AXIS I:  Alcohol Abuse, Anxiety Disorder NOS and Depressive Disorder NOS AXIS II:  Deferred AXIS III:   Past Medical History  Diagnosis Date  . Varicose veins    AXIS IV:  problems with primary support group AXIS V:  61-70 mild symptoms  Plan Of Care/Follow-up recommendations:  Activity:  Will be at [facility] [employer] today Diet:  Regular. Has regained a lot of the weight he lost Will pursue psychotherapy, as well as the medications. Will go to AA  Is patient on multiple antipsychotic therapies at discharge:  No   Has Patient had three or more failed trials of antipsychotic monotherapy by history:  No  Recommended Plan for Multiple Antipsychotic Therapies: N/A  Douglas Morgan A 08/12/2012, 11:55 AM

## 2012-08-12 NOTE — Progress Notes (Signed)
[facility] Case Management Discharge Plan:  Will you be returning to the same living situation after discharge: Yes,  Patient returns to his home where he lives alone, has family (mom and sister) whom patient can also stay with patient if needed. At discharge, do you have transportation home?:Yes,  Mother or sister will bring patient truck up to patient at [facility] Do you have the ability to pay for your medications: Yes.      Release of information consent forms completed and in the chart;  Patient's signature needed at discharge.  Patient to Follow up at:  Follow-up Information    Follow up with [facility]. On 08/17/2012. (Arrive at 8:00 am on this date!)    Contact information:   [address], [place] [address] [phone]         Patient denies SI/HI:   Yes,  Able to contract for safety and reports no SI, HI, plan or intent. Patient reports he is doing well and ready to go.    Safety Planning and Suicide Prevention discussed:  Yes,  Education given to patient as well as mother. Warning signs discussed as well as crisis mobile given to patient.  Barrier to discharge identified:No.  Summary and Recommendations:  Patient attended aftercare planning group this morning positive mood and affect.  Patient agreeable for discharge today arranged after care planning with [facility] as well as [facility] clinic and [facility] of the [place]. Patient reports no SI, HI, or plan/intent.  Patient to return to home with his dog with support from mother and sis.ter.  Patient will also attend A.A. Group and seek a sponsor within the community.  No barriers to DC.   Nail, Catalina Gravel 08/12/2012, 9:47 AM

## 2012-08-12 NOTE — Progress Notes (Signed)
Patient ID: Douglas Morgan, male   DOB: 07-23-1986, 26 y.o.   MRN: [id number] Nsg D/C note:  Patient cooperative during d/c process.  Reviewed all instructions and medication information. Patient verbalized understanding.  Denies SI/HI. Sample rx's given. All belongings returned and escorted to lobby to impending family member pick up.

## 2012-08-16 NOTE — Progress Notes (Signed)
Patient Discharge Instructions:  After Visit Summary (AVS):   Faxed to:  08/16/12 Psychiatric Admission Assessment Note:   Faxed to:  08/16/12 Suicide Risk Assessment - Discharge Assessment:   Faxed to:  08/16/12 Faxed/Sent to the Next Level Care provider:  08/16/12 Faxed to [facility] @ [phone]  Jerelene Redden, 08/16/2012, 4:18 PM

## 2012-08-17 NOTE — H&P (Signed)
Agree with assessment and plan 

## 2012-08-28 NOTE — Discharge Summary (Signed)
Agree with assessment and plan Neeraj Housand A. Kayvan Hoefling, M.D. 

## 2012-12-26 ENCOUNTER — Emergency Department (HOSPITAL_COMMUNITY)
Admission: EM | Admit: 2012-12-26 | Discharge: 2012-12-26 | Disposition: A | Discharge status: Home / Self Care | Payer: No Typology Code available for payment source | Attending: Emergency Medicine | Admitting: Emergency Medicine

## 2012-12-26 ENCOUNTER — Emergency Department (HOSPITAL_COMMUNITY): Payer: No Typology Code available for payment source

## 2012-12-26 ENCOUNTER — Encounter (HOSPITAL_COMMUNITY): Payer: Self-pay | Admitting: Emergency Medicine

## 2012-12-26 DIAGNOSIS — Z8679 Personal history of other diseases of the circulatory system: Secondary | ICD-10-CM | POA: Insufficient documentation

## 2012-12-26 DIAGNOSIS — Y9389 Activity, other specified: Secondary | ICD-10-CM | POA: Insufficient documentation

## 2012-12-26 DIAGNOSIS — IMO0002 Reserved for concepts with insufficient information to code with codable children: Secondary | ICD-10-CM | POA: Insufficient documentation

## 2012-12-26 DIAGNOSIS — S6000XA Contusion of unspecified finger without damage to nail, initial encounter: Secondary | ICD-10-CM

## 2012-12-26 DIAGNOSIS — F17200 Nicotine dependence, unspecified, uncomplicated: Secondary | ICD-10-CM | POA: Insufficient documentation

## 2012-12-26 DIAGNOSIS — S161XXA Strain of muscle, fascia and tendon at neck level, initial encounter: Secondary | ICD-10-CM

## 2012-12-26 DIAGNOSIS — S6990XA Unspecified injury of unspecified wrist, hand and finger(s), initial encounter: Secondary | ICD-10-CM | POA: Insufficient documentation

## 2012-12-26 DIAGNOSIS — S139XXA Sprain of joints and ligaments of unspecified parts of neck, initial encounter: Secondary | ICD-10-CM | POA: Insufficient documentation

## 2012-12-26 DIAGNOSIS — S40019A Contusion of unspecified shoulder, initial encounter: Secondary | ICD-10-CM | POA: Insufficient documentation

## 2012-12-26 DIAGNOSIS — Y92410 Unspecified street and highway as the place of occurrence of the external cause: Secondary | ICD-10-CM | POA: Insufficient documentation

## 2012-12-26 DIAGNOSIS — S8990XA Unspecified injury of unspecified lower leg, initial encounter: Secondary | ICD-10-CM | POA: Insufficient documentation

## 2012-12-26 DIAGNOSIS — IMO0001 Reserved for inherently not codable concepts without codable children: Secondary | ICD-10-CM

## 2012-12-26 MED ORDER — OXYCODONE-ACETAMINOPHEN 5-325 MG PO TABS: 1.0000 | ORAL_TABLET | ORAL | Status: DC | PRN

## 2012-12-26 MED ORDER — NAPROXEN 500 MG PO TABS: 500.0000 mg | ORAL_TABLET | Freq: Two times a day (BID) | ORAL | Status: DC

## 2012-12-26 MED ORDER — IBUPROFEN 400 MG PO TABS
800.0000 mg | ORAL_TABLET | Freq: Once | ORAL | Status: AC
  Administered 2012-12-26: 800 mg via ORAL
  Filled 2012-12-26: qty 2
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
## 2013-07-04 ENCOUNTER — Encounter: Payer: Self-pay | Admitting: Internal Medicine

## 2013-07-04 ENCOUNTER — Ambulatory Visit: Payer: Self-pay | Attending: Family Medicine | Admitting: Internal Medicine

## 2013-07-04 VITALS — BP 134/78 | HR 84 | Temp 97.8°F | Resp 16 | Ht 76.77 in | Wt 193.0 lb

## 2013-07-04 DIAGNOSIS — F17200 Nicotine dependence, unspecified, uncomplicated: Secondary | ICD-10-CM | POA: Insufficient documentation

## 2013-07-04 DIAGNOSIS — I83019 Varicose veins of right lower extremity with ulcer of unspecified site: Secondary | ICD-10-CM

## 2013-07-04 DIAGNOSIS — I83009 Varicose veins of unspecified lower extremity with ulcer of unspecified site: Secondary | ICD-10-CM

## 2013-07-04 DIAGNOSIS — I83893 Varicose veins of bilateral lower extremities with other complications: Secondary | ICD-10-CM | POA: Insufficient documentation

## 2013-07-04 MED ORDER — TRAMADOL HCL 50 MG PO TABS: 50.0000 mg | ORAL_TABLET | Freq: Three times a day (TID) | ORAL | Status: DC | PRN

## 2013-07-04 NOTE — Progress Notes (Signed)
Patient ID: Douglas Morgan, male   DOB: Feb 18, 1986, 27 y.o.   MRN: [id number]  CC: New patient  HPI: 27 year old male with past medical history of varicose veins were presented to clinic needing a referral to vascular surgery. Patient reports having bilateral lower strandy varicose veins ever since the age of 17. Patient reports sometimes having associated swelling and pain on ambulation. He now developed varicose ulcer on the right lower extremity. He reports no other rash. Patient is able to ambulate and carry out activities of daily living however reports significant pain from lower extremities up to groin area even at rest but especially on movement. Pain is sharp and intermittent, 7/10 in intensity. He takes Percocet at home which does provide pain relief.  No Known Allergies Past Medical History  Diagnosis Date  . Varicose veins    Current Outpatient Prescriptions on File Prior to Visit  Medication Sig Dispense Refill  . oxyCODONE-acetaminophen (PERCOCET/ROXICET) 5-325 MG per tablet Take 1 tablet by mouth every 4 (four) hours as needed for pain.  15 tablet  0   No current facility-administered medications on file prior to visit.   Family history significant for mother having diabetes and depression  History   Social History  . Marital Status: Single    Spouse Name: N/A    Number of Children: N/A  . Years of Education: N/A   Occupational History  . Not on file.   Social History Main Topics  . Smoking status: Current Every Day Smoker -- 0.50 packs/day    Types: Cigarettes  . Smokeless tobacco: Not on file  . Alcohol Use: Yes     Comment: very rarely  . Drug Use: Yes    Special: Marijuana, Cocaine, Benzodiazepines  . Sexual Activity: Yes     Comment: partner on birth control   Other Topics Concern  . Not on file   Social History Narrative  . No narrative on file    Review of Systems  Constitutional: Negative for fever, chills, diaphoresis, activity change, appetite  change and fatigue.  HENT: Negative for ear pain, nosebleeds, congestion, facial swelling, rhinorrhea, neck pain, neck stiffness and ear discharge.   Eyes: Negative for pain, discharge, redness, itching and visual disturbance.  Respiratory: Negative for cough, choking, chest tightness, shortness of breath, wheezing and stridor.   Cardiovascular: Negative for chest pain, palpitations and leg swelling.  Gastrointestinal: Negative for abdominal distention.  Genitourinary: Negative for dysuria, urgency, frequency, hematuria, flank pain, decreased urine volume, difficulty urinating and dyspareunia.  Musculoskeletal: Negative for back pain, joint swelling, arthralgias and gait problem.  Neurological: Negative for dizziness, tremors, seizures, syncope, facial asymmetry, speech difficulty, weakness, light-headedness, numbness and headaches.  Hematological: Negative for adenopathy. Does not bruise/bleed easily.  Psychiatric/Behavioral: Negative for hallucinations, behavioral problems, confusion, dysphoric mood, decreased concentration and agitation.    Objective:   Filed Vitals:   07/04/13 1114  BP: 134/78  Pulse:   Temp:   Resp:     Physical Exam  Constitutional: Appears well-developed and well-nourished. No distress.  HENT: Normocephalic. External right and left ear normal. Oropharynx is clear and moist.  Eyes: Conjunctivae and EOM are normal. PERRLA, no scleral icterus.  Neck: Normal ROM. Neck supple. No JVD. No tracheal deviation. No thyromegaly.  CVS: RRR, S1/S2 +, no murmurs, no gallops, no carotid bruit.  Pulmonary: Effort and breath sounds normal, no stridor, rhonchi, wheezes, rales.  Abdominal: Soft. BS +,  no distension, tenderness, rebound or guarding.  Musculoskeletal: Normal range of motion.  No edema and no tenderness.  Lymphadenopathy: No lymphadenopathy noted, cervical, inguinal. Neuro: Alert. Normal reflexes, muscle tone coordination. No cranial nerve deficit. Skin: Skin is  warm and dry. Right lower extremity varicose ulcer present. No surrounding erythema or other evidence of rash. Varicose veins in lower strength is bilaterally  appreciated  Psychiatric: Normal mood and affect. Behavior, judgment, thought content normal.   Lab Results  Component Value Date   WBC 10.5 08/08/2012   HGB 15.9 08/08/2012   HCT 46.7 08/08/2012   MCV 94.3 08/08/2012   PLT 281 08/08/2012   Lab Results  Component Value Date   CREATININE 0.99 08/08/2012   BUN 12 08/08/2012   NA 137 08/08/2012   K 3.9 08/08/2012   CL 100 08/08/2012   CO2 26 08/08/2012    No results found for this basename: HGBA1C   Lipid Panel  No results found for this basename: chol, trig, hdl, cholhdl, vldl, ldlcalc       Assessment and plan:   Patient Active Problem List   Diagnosis Date Noted  . Varicose ulcer 07/04/2013    Priority: High - Referral to vascular surgery provided  - Prescription provided for Ultram when necessary  - Referral provided for pain management clinic

## 2013-07-04 NOTE — Progress Notes (Signed)
Pt is here to establish care. Pt reports of having predominate veins that a raised up mostly on the right shin. Pt has had these symptoms since he was 19 Pt said that the leg swells occasionally and causes severe pain, today pain is an 8 and aches all the time.

## 2013-07-04 NOTE — Patient Instructions (Signed)
Varicose Veins  Varicose veins are veins that have become enlarged and twisted.  CAUSES  This condition is the result of valves in the veins not working properly. Valves in the veins help return blood from the leg to the heart. If these valves are damaged, blood flows backwards and backs up into the veins in the leg near the skin. This causes the veins to become larger. People who are on their feet a lot, who are pregnant, or who are overweight are more likely to develop varicose veins.  SYMPTOMS   · Bulging, twisted-appearing, bluish veins, most commonly found on the legs.  · Leg pain or a feeling of heaviness. These symptoms may be worse at the end of the day.  · Leg swelling.  · Skin color changes.  DIAGNOSIS   Varicose veins can usually be diagnosed with an exam of your legs by your caregiver. He or she may recommend an ultrasound of your leg veins.  TREATMENT   Most varicose veins can be treated at home. However, other treatments are available for people who have persistent symptoms or who want to treat the cosmetic appearance of the varicose veins. These include:  · Laser treatment of very small varicose veins.  · Medicine that is shot (injected) into the vein. This medicine hardens the walls of the vein and closes off the vein. This treatment is called sclerotherapy. Afterwards, you may need to wear clothing or bandages that apply pressure.  · Surgery.  HOME CARE INSTRUCTIONS   · Do not stand or sit in one position for long periods of time. Do not sit with your legs crossed. Rest with your legs raised during the day.  · Wear elastic stockings or support hose. Do not wear other tight, encircling garments around the legs, pelvis, or waist.  · Walk as much as possible to increase blood flow.  · Raise the foot of your bed at night with 2-inch blocks.  · If you get a cut in the skin over the vein and the vein bleeds, lie down with your leg raised and press on it with a clean cloth until the bleeding stops. Then  place a bandage (dressing) on the cut. See your caregiver if it continues to bleed or needs stitches.  SEEK MEDICAL CARE IF:   · The skin around your ankle starts to break down.  · You have pain, redness, tenderness, or hard swelling developing in your leg over a vein.  · You are uncomfortable due to leg pain.  Document Released: 07/15/2005 Document Revised: 12/28/2011 Document Reviewed: 12/01/2010  [employer]® Patient Information ©2014 [employer].

## 2013-07-10 ENCOUNTER — Other Ambulatory Visit: Payer: Self-pay | Admitting: *Deleted

## 2013-07-10 DIAGNOSIS — I83019 Varicose veins of right lower extremity with ulcer of unspecified site: Secondary | ICD-10-CM

## 2013-07-13 ENCOUNTER — Encounter: Payer: Self-pay | Admitting: Surgery

## 2013-08-11 ENCOUNTER — Encounter: Payer: Self-pay | Admitting: Surgery

## 2013-08-14 ENCOUNTER — Encounter: Payer: Self-pay | Admitting: Surgery

## 2013-08-14 ENCOUNTER — Encounter (INDEPENDENT_AMBULATORY_CARE_PROVIDER_SITE_OTHER): Payer: Self-pay

## 2013-08-14 ENCOUNTER — Ambulatory Visit (HOSPITAL_COMMUNITY)
Admission: RE | Admit: 2013-08-14 | Discharge: 2013-08-14 | Disposition: A | Discharge status: Home / Self Care | Payer: Self-pay | Source: Ambulatory Visit | Attending: Surgery | Admitting: Surgery

## 2013-08-14 ENCOUNTER — Ambulatory Visit (INDEPENDENT_AMBULATORY_CARE_PROVIDER_SITE_OTHER): Payer: Self-pay | Admitting: Surgery

## 2013-08-14 VITALS — BP 131/75 | HR 102 | Ht 76.0 in | Wt 194.9 lb

## 2013-08-14 DIAGNOSIS — I83009 Varicose veins of unspecified lower extremity with ulcer of unspecified site: Secondary | ICD-10-CM | POA: Insufficient documentation

## 2013-08-14 DIAGNOSIS — I83019 Varicose veins of right lower extremity with ulcer of unspecified site: Secondary | ICD-10-CM

## 2013-08-14 NOTE — Progress Notes (Signed)
[facility] [place]   Patient name: Douglas Morgan MRN: [id number] DOB: Mar 08, 1986 Sex: male   [facility]  Reason for referral:  Chief Complaint  Patient presents with  . New Evaluation    vv's with ulcer on R shin - pt c/o severe pain in LE/groin and back     HISTORY OF PRESENT ILLNESS: The patient comes in today for evaluation of the veins in his leg and a ulcer.  The patient states that he has had a ulcer on his right leg since January of 2013.  He has also noticed a progression in the bulging varicose veins in his right leg.  He allegedly saw a cardiovascular physician 8 years ago told him that "he needed stents in the arteries they got blood out of his leg."  He also states that he was told he could not afford them and so they would not be done.  The patient states that he has significant swelling at the end of the day particularly in the right leg.  Elevation does help a little bit.  He has been wearing some form of compression stockings with some benefit.  He also complains of pain in the scrotum which radiates around to his back.  He denies symptoms of claudication.  Past Medical History  Diagnosis Date  . Varicose veins   . Leg pain   . Ulcer     History reviewed. No pertinent past surgical history.  History   Social History  . Marital Status: Single    Spouse Name: N/A    Number of Children: N/A  . Years of Education: N/A   Occupational History  . Not on file.   Social History Main Topics  . Smoking status: Current Every Day Smoker -- 0.50 packs/day for 10 years    Types: Cigarettes  . Smokeless tobacco: Not on file  . Alcohol Use: 1.2 oz/week    2 Cans of beer per week     Comment: very rarely  . Drug Use: Yes    Special: Marijuana, Cocaine, Benzodiazepines  . Sexual Activity: Yes     Comment: partner on birth control   Other Topics Concern  . Not on file   Social History Narrative  . No  narrative on file    Family History  Problem Relation Age of Onset  . Depression Mother   . Diabetes Mother   . Hypertension Mother   . Heart disease Father     before age 18    Allergies as of 08/14/2013  . (No Known Allergies)    Current Outpatient Prescriptions on File Prior to Visit  Medication Sig Dispense Refill  . oxyCODONE-acetaminophen (PERCOCET/ROXICET) 5-325 MG per tablet Take 1 tablet by mouth every 4 (four) hours as needed for pain.  15 tablet  0  . traMADol (ULTRAM) 50 MG tablet Take 1 tablet (50 mg total) by mouth every 8 (eight) hours as needed for pain.  90 tablet  1   No current facility-administered medications on file prior to visit.     REVIEW OF SYSTEMS: Cardiovascular: Positive for shortness of breath when lying flat and with exertion.  Positive pain in legs and walking and when lying flat.  Positive for swelling in his legs as well as varicose veins. Pulmonary: No productive cough, asthma positive for wheezing Neurologic: No weakness, paresthesias, aphasia, or amaurosis.  Positive for dizziness. Hematologic: No bleeding problems or clotting disorders. Musculoskeletal: No joint  pain or joint swelling. Gastrointestinal: No blood in stool or hematemesis Genitourinary: No dysuria or hematuria. Psychiatric:: No history of major depression. Integumentary: No rashes or ulcers. Constitutional: No fever or chills.  PHYSICAL EXAMINATION: General: The patient appears their stated age.  Vital signs are BP 131/75  Pulse 102  Ht 6\' 4"  (1.93 m)  Wt 194 lb 14.4 oz (88.406 kg)  BMI 23.73 kg/m2  SpO2 100% HEENT:  No gross abnormalities Pulmonary: Respirations are non-labored Musculoskeletal: There are no major deformities.   Neurologic: No focal weakness or paresthesias are detected, Skin: There are no ulcer or rashes noted. Psychiatric: The patient has normal affect. Cardiovascular: There is a regular rate and rhythm without significant murmur appreciated.   Palpable pedal pulses bilaterally.  Multiple bulging varicosities on the anterior right calf.  In the mid anterior calf there is a scabbed over area which appears to be a chronic nearly healed ulcer.  He does have 2 punctate areas of varices on the left leg.  Diagnostic Studies: Venous ultrasound was performed today on the right leg.  This shows no evidence of deep vein obstruction.  There is deep vein reflux in the common femoral, and femoral and popliteal veins.  He does have reflux within the right great saphenous with maximum vein diameter of 0.84 cm.  There are 2 perforators in the area of varicosities on the right leg.   Assessment:  Varicose veins with complications, right leg Plan: The patient has had worsening and more prominent varicosities on the right leg for over 2 years.  He has also had a wound that will not disappear completely on the right leg.  Ultrasound today showed reflux in both the deep and superficial system.  I have recommended that the patient try 20-30 mm thigh-high compression stockings for 3 months to see if this helps his symptoms.  Consideration will be for laser ablation of the right great saphenous vein, and stab phlebectomy of the prominent varicosities in the right leg.  He may potentially require perforator ablation as well.  He will followup in 3 months     V. Charlena Cross, M.D. [facility] [place] Office: [phone] Pager:  [phone]

## 2013-10-01 ENCOUNTER — Emergency Department (HOSPITAL_COMMUNITY)
Admission: EM | Admit: 2013-10-01 | Discharge: 2013-10-01 | Disposition: A | Discharge status: Home / Self Care | Payer: Self-pay | Attending: Emergency Medicine | Admitting: Emergency Medicine

## 2013-10-01 ENCOUNTER — Encounter (HOSPITAL_COMMUNITY): Payer: Self-pay | Admitting: Emergency Medicine

## 2013-10-01 DIAGNOSIS — Z872 Personal history of diseases of the skin and subcutaneous tissue: Secondary | ICD-10-CM | POA: Insufficient documentation

## 2013-10-01 DIAGNOSIS — Y9389 Activity, other specified: Secondary | ICD-10-CM | POA: Insufficient documentation

## 2013-10-01 DIAGNOSIS — Z8679 Personal history of other diseases of the circulatory system: Secondary | ICD-10-CM | POA: Insufficient documentation

## 2013-10-01 DIAGNOSIS — Z8739 Personal history of other diseases of the musculoskeletal system and connective tissue: Secondary | ICD-10-CM | POA: Insufficient documentation

## 2013-10-01 DIAGNOSIS — S01501A Unspecified open wound of lip, initial encounter: Secondary | ICD-10-CM | POA: Insufficient documentation

## 2013-10-01 DIAGNOSIS — W540XXA Bitten by dog, initial encounter: Secondary | ICD-10-CM | POA: Insufficient documentation

## 2013-10-01 DIAGNOSIS — Y92009 Unspecified place in unspecified non-institutional (private) residence as the place of occurrence of the external cause: Secondary | ICD-10-CM | POA: Insufficient documentation

## 2013-10-01 DIAGNOSIS — IMO0002 Reserved for concepts with insufficient information to code with codable children: Secondary | ICD-10-CM

## 2013-10-01 DIAGNOSIS — F17200 Nicotine dependence, unspecified, uncomplicated: Secondary | ICD-10-CM | POA: Insufficient documentation

## 2013-10-01 MED ORDER — BUPIVACAINE HCL (PF) 0.5 % IJ SOLN
10.0000 mL | Freq: Once | INTRAMUSCULAR | Status: AC
  Administered 2013-10-01: 10 mL
  Filled 2013-10-01: qty 10

## 2013-10-01 MED ORDER — HYDROCODONE-ACETAMINOPHEN 5-325 MG PO TABS
1.0000 | ORAL_TABLET | Freq: Once | ORAL | Status: AC
  Administered 2013-10-01: 1 via ORAL
  Filled 2013-10-01: qty 1

## 2013-10-01 MED ORDER — LIDOCAINE HCL (PF) 1 % IJ SOLN
5.0000 mL | Freq: Once | INTRAMUSCULAR | Status: AC
  Administered 2013-10-01: 5 mL
  Filled 2013-10-01: qty 5
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
## 2013-10-01 NOTE — ED Provider Notes (Signed)
Medical screening examination/treatment/procedure(s) were performed by non-physician practitioner and as supervising physician I was immediately available for consultation/collaboration.  Darlys Gales, MD 10/01/13 [phone]
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
## 2013-10-01 NOTE — ED Notes (Signed)
Pt st's his dog jumped up and scratched his upper lip.  Pt has lac to left upper lip.  Bleeding controlled at this time.

## 2013-10-01 NOTE — ED Notes (Signed)
The pt has been drinking alcohol all day

## 2013-11-04 ENCOUNTER — Encounter (HOSPITAL_COMMUNITY): Payer: Self-pay | Admitting: Emergency Medicine

## 2013-11-04 ENCOUNTER — Emergency Department (HOSPITAL_COMMUNITY)
Admission: EM | Admit: 2013-11-04 | Discharge: 2013-11-04 | Disposition: A | Discharge status: Home / Self Care | Payer: Self-pay | Attending: Emergency Medicine | Admitting: Emergency Medicine

## 2013-11-04 DIAGNOSIS — F17200 Nicotine dependence, unspecified, uncomplicated: Secondary | ICD-10-CM | POA: Insufficient documentation

## 2013-11-04 DIAGNOSIS — K029 Dental caries, unspecified: Secondary | ICD-10-CM | POA: Insufficient documentation

## 2013-11-04 DIAGNOSIS — K0889 Other specified disorders of teeth and supporting structures: Secondary | ICD-10-CM

## 2013-11-04 DIAGNOSIS — K089 Disorder of teeth and supporting structures, unspecified: Secondary | ICD-10-CM | POA: Insufficient documentation

## 2013-11-04 DIAGNOSIS — Z872 Personal history of diseases of the skin and subcutaneous tissue: Secondary | ICD-10-CM | POA: Insufficient documentation

## 2013-11-04 MED ORDER — PENICILLIN V POTASSIUM 500 MG PO TABS: 500.0000 mg | ORAL_TABLET | Freq: Four times a day (QID) | ORAL | Status: AC

## 2013-11-04 MED ORDER — TRAMADOL HCL 50 MG PO TABS
50.0000 mg | ORAL_TABLET | Freq: Once | ORAL | Status: AC
  Administered 2013-11-04: 50 mg via ORAL
  Filled 2013-11-04: qty 1

## 2013-11-04 MED ORDER — HYDROCODONE-ACETAMINOPHEN 5-325 MG PO TABS: 1.0000 | ORAL_TABLET | Freq: Four times a day (QID) | ORAL | Status: DC | PRN

## 2013-11-04 MED ORDER — TRAMADOL HCL 50 MG PO TABS: 50.0000 mg | ORAL_TABLET | Freq: Four times a day (QID) | ORAL | Status: DC | PRN

## 2013-11-04 NOTE — ED Notes (Signed)
Pt requesting rx for percocet due to vicodin "making him sick". Also, something for pain before he is discharged. Reports he has a ride.

## 2013-11-04 NOTE — ED Notes (Signed)
Pt c/o right lower toothache onset Wednesday. Pt reports broke a piece of the tooth last year, recently more of the tooth broke off. Pt tried 2 ASA at 0700 this morning without relief.

## 2013-11-04 NOTE — ED Notes (Signed)
Pt ambulatory to discharge. No belongings left in room. His ride is picking him up.

## 2013-11-04 NOTE — ED Provider Notes (Signed)
CSN: [id number]     Arrival date & time 11/04/13  1657 History  This chart was scribed for Santiago Glad, PA, working with Gavin Pound. Oletta Lamas, MD, by Ardelia Mems ED Scribe. This patient was seen in room TR06C/TR06C and the patient's care was started at 5:26 PM.   Chief Complaint  Patient presents with  . Dental Pain    The history is provided by the patient. No language interpreter was used.    HPI Comments: Douglas Morgan is a 28 y.o. male who presents to the Emergency Department complaining of constant, moderate right lower dental pain over the past 5 days. He reports radiation of his dental pain to his right ear, right temple and right eye area. He reports associated swelling to his gumline and he states that he his face feels swollen. He states that his pain has been gradually worsening over the past 5 days. He states that he has not been able to sleep at times, and that he was not able to work today due to pain.  He states that he has tried Ibuprofen, Tylenol, Aspirin and Orajel without relief of his pain. He also states that he has taken 2 unknown antibiotic pills from a prescription of his sister's without relief. He denies fever, chills, difficulty swallowing or any other symptoms. Pt is a current every day smoker of 0.5 packs/day for the past 10 years. He states that he does not have a Education officer, community.     Past Medical History  Diagnosis Date  . Varicose veins   . Leg pain   . Ulcer    History reviewed. No pertinent past surgical history. Family History  Problem Relation Age of Onset  . Depression Mother   . Diabetes Mother   . Hypertension Mother   . Heart disease Father     before age 33   History  Substance Use Topics  . Smoking status: Current Every Day Smoker -- 0.50 packs/day for 10 years    Types: Cigarettes  . Smokeless tobacco: Not on file  . Alcohol Use: 1.2 oz/week    2 Cans of beer per week     Comment: very rarely    Review of Systems  Constitutional: Negative  for fever and chills.  HENT: Positive for dental problem and facial swelling. Negative for trouble swallowing.   All other systems reviewed and are negative.   Allergies  Review of patient's allergies indicates no known allergies.  Home Medications   Current Outpatient Rx  Name  Route  Sig  Dispense  Refill  . HYDROcodone-acetaminophen (NORCO/VICODIN) 5-325 MG per tablet   Oral   Take 1-2 tablets by mouth every 6 (six) hours as needed.   15 tablet   0   . penicillin v potassium (VEETID) 500 MG tablet   Oral   Take 1 tablet (500 mg total) by mouth 4 (four) times daily.   40 tablet   0     Triage Vitals: BP 143/84  Pulse 92  Temp(Src) 98.2 F (36.8 C) (Oral)  Resp 20  SpO2 99%  Physical Exam  Nursing note and vitals reviewed. Constitutional: He is oriented to person, place, and time. He appears well-developed and well-nourished. No distress.  HENT:  Head: Normocephalic and atraumatic.  Mouth/Throat: Uvula is midline, oropharynx is clear and moist and mucous membranes are normal. No trismus in the jaw. Abnormal dentition. No dental abscesses or uvula swelling. No oropharyngeal exudate, posterior oropharyngeal edema, posterior oropharyngeal erythema or tonsillar abscesses.  Poor dental hygiene. Diffuse dental decay.  Pt able to open and close mouth with out difficulty. Airway intact. Uvula midline. Mild gingival swelling with tenderness over affected area, but no fluctuance. No swelling or tenderness of submental and submandibular regions.  Eyes: Conjunctivae and EOM are normal.  Neck: Normal range of motion and full passive range of motion without pain. Neck supple. No tracheal deviation present.  Cardiovascular: Normal rate, regular rhythm and normal heart sounds.   Pulmonary/Chest: Effort normal and breath sounds normal. No stridor. No respiratory distress. He has no wheezes.  Musculoskeletal: Normal range of motion.  Lymphadenopathy:       Head (right side): No  submental, no submandibular, no tonsillar, no preauricular and no posterior auricular adenopathy present.       Head (left side): No submental, no submandibular, no tonsillar, no preauricular and no posterior auricular adenopathy present.    He has no cervical adenopathy.  Neurological: He is alert and oriented to person, place, and time.  Skin: Skin is warm and dry. No rash noted. He is not diaphoretic.  Psychiatric: He has a normal mood and affect. His behavior is normal.    ED Course  Procedures (including critical care time)  DIAGNOSTIC STUDIES: Oxygen Saturation is 99% on RA, normal by my interpretation.    COORDINATION OF CARE: 5:29 PM- Discussed plan to discharge with antibiotics and pain medication. Pt advised of plan for treatment and pt agrees.  Labs Review Labs Reviewed - No data to display Imaging Review No results found.  EKG Interpretation   None       MDM   1. Pain, dental    Patient with toothache.  No gross abscess.  Exam unconcerning for Ludwig's angina or spread of infection.  Will treat with penicillin and pain medicine.  Urged patient to follow-up with dentist.    I personally performed the services described in this documentation, which was scribed in my presence. The recorded information has been reviewed and is accurate.    Santiago GladHeather Montrae Braithwaite, PA-C 11/04/13 1850

## 2013-11-04 NOTE — Discharge Instructions (Signed)
You have a dental injury. Use the resource guide listed below to help you find a dentist if you do not already have one to followup with. It is very important that you get evaluated by a dentist as soon as possible. Call tomorrow to schedule an appointment. Use your pain medication as prescribed and do not operate heavy machinery while on pain medication. Note that your pain medication contains acetaminophen (Tylenol) & its is not recommended that you use additional acetaminophen (Tylenol) while taking this medication. Take your full course of antibiotics. Read the instructions below. ° °Eat a soft or liquid diet and rinse your mouth out after meals with warm water. You should see a dentist or return here at once if you have increased swelling, increased pain or uncontrolled bleeding from the site of your injury. ° ° °SEEK MEDICAL CARE IF:  °· You have increased pain not controlled with medicines.  °· You have swelling around your tooth, in your face or neck.  °· You have bleeding which starts, continues, or gets worse.  °· You have a fever >101 °· If you are unable to open your mouth ° °RESOURCE GUIDE ° °Dental Problems ° °Patients with Medicaid: °[facility]                     [facility] °[address]                                           [address] °Phone:  [phone]                                                  Phone:  510-2600 ° °If unable to pay or uninsured, contact:  [facility] or [facility] to become qualified for the adult dental clinic. ° °Chronic Pain Problems °Contact [facility]  [phone] °Patients need to be referred by their primary care doctor. ° °Insufficient Money for Medicine °Contact [employer]:  call "211" or [facility] 271-5999. ° °No Primary Care Doctor °Call [facility]  832-8000 °Other agencies that provide inexpensive medical care °   [facility]  [phone] °   [facility]  832-7272 °   [facility]  271-5999 °   [facility]  832-4777 °   [facility]  373-0678 °   [facility]  272-1050 ° °Psychological Services °[facility]  832-9600 °[facility]  378-7881 °[facility]   800 853-5163 (emergency services 641-4993) ° °Substance Abuse Resources °[facility]  336-882-2125 °[facility] 336-784-9470 °[facility] 336-285-9073 °[facility] 336-845-3988 °Residential & Outpatient Substance Abuse Program  800-659-3381 ° °Abuse/Neglect °[place] [facility] (336) 641-3795 °[place] [facility] 800-378-5315 (After Hours) ° °Emergency Shelter ° [facility] (336) 271-5985 ° °Maternity Homes °[facility] (336) 275-9566 °[facility] (704) 372-4663 ° °MRSA Hotline #:   832-7006 ° ° ° °[place] [facility] ° °[facility] [facility]     [employer]                            [facility] °[address] [place]                       [address]      [address]  °[place]                                                [place]                            [place] °Phone:  349-3220                                   Phone:  342-7768                 Phone:  342-8140 ° °[facility] °Phone:  342-8316 ° °[place] [facility] °(336) 342-1394 °(336) 342-3537 (After Hours) ° ° ° ° °

## 2013-11-05 NOTE — ED Provider Notes (Signed)
Medical screening examination/treatment/procedure(s) were performed by non-physician practitioner and as supervising physician I was immediately available for consultation/collaboration.  EKG Interpretation   None         Douglas PoundMichael Y. Oletta LamasGhim, MD 11/05/13 2355

## 2013-11-13 ENCOUNTER — Encounter: Payer: Self-pay | Admitting: Vascular Surgery

## 2013-11-14 ENCOUNTER — Encounter: Payer: Self-pay | Admitting: Vascular Surgery

## 2013-11-14 ENCOUNTER — Ambulatory Visit (INDEPENDENT_AMBULATORY_CARE_PROVIDER_SITE_OTHER): Payer: Self-pay | Admitting: Vascular Surgery

## 2013-11-14 VITALS — BP 131/81 | HR 88 | Resp 18 | Ht 77.0 in | Wt 180.5 lb

## 2013-11-14 DIAGNOSIS — I83893 Varicose veins of bilateral lower extremities with other complications: Secondary | ICD-10-CM | POA: Insufficient documentation

## 2013-11-14 NOTE — Progress Notes (Signed)
Problems with Activities of Daily Living Secondary to Leg Pain  1. Mr. Douglas Morgan states his job requires him to stand for 10 hour shifts and this is difficult due to leg pain.  2. Mr. Douglas Morgan states Douglas Morgan is difficult due to leg pain.  3. Mr.  Douglas Morgan states that he cannot exercise due to leg pain.     Failure of  Conservative Therapy:  1. Worn 20-30 mm Hg thigh high compression hose >3 months with no relief of symptoms.  2. Frequently elevates legs-no relief of symptoms  3. Taken Ibuprofen 600 Mg TID with no relief of symptoms.  The patient has today for continued discussion of the severe right leg venous hypertension. He is here today with his mother. He continues to have significant pain despite very compliant use of his compression garment. 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  His mother is convinced that he was seen in our office years ago and was told about arterial disease requiring a stent. I was able to obtain our old records. He did see Dr. Hart RochesterLawson in September of 2007. At that time he did have a venous duplex which showed incompetence of the saphenous vein and there was discussion of possible laser ablation. Dr. Hart RochesterLawson cement specifically address is normal pulse status bilaterally with no evidence of arterial problems. I reassured the patient and his mother that time completely comfortable there is no concern regarding arterial insufficiency.  On physical exam today, he continues to have completely normal femoral popliteal dorsalis pedis and posterior tibial pulses that are equal bilaterally. He has a very large varicosities over his calf and pretibial area on the right. There middle minimal varicosities in the left calf.  I reviewed his duplex with the patient and his mother. This does show gross reflux in his great saphenous vein with enlargement throughout its course.  I feel that he has clearly failed conservative  treatment. I have recommended laser ablation of his great saphenous vein on the left and stab phlebectomy of his multiple tributary varicosities. Unfortunately he has no insurance coverage. [facility].  We will proceed with treatment following this. He understands this is outpatient under local anesthesia with a slight risk of DVT.

## 2013-11-19 ENCOUNTER — Encounter (HOSPITAL_COMMUNITY): Payer: Self-pay | Admitting: Emergency Medicine

## 2013-11-19 ENCOUNTER — Emergency Department (HOSPITAL_COMMUNITY)
Admission: EM | Admit: 2013-11-19 | Discharge: 2013-11-19 | Disposition: A | Discharge status: Home / Self Care | Payer: Self-pay | Attending: Emergency Medicine | Admitting: Emergency Medicine

## 2013-11-19 DIAGNOSIS — F17200 Nicotine dependence, unspecified, uncomplicated: Secondary | ICD-10-CM | POA: Insufficient documentation

## 2013-11-19 DIAGNOSIS — Z872 Personal history of diseases of the skin and subcutaneous tissue: Secondary | ICD-10-CM | POA: Insufficient documentation

## 2013-11-19 DIAGNOSIS — K029 Dental caries, unspecified: Secondary | ICD-10-CM | POA: Insufficient documentation

## 2013-11-19 DIAGNOSIS — Z8679 Personal history of other diseases of the circulatory system: Secondary | ICD-10-CM | POA: Insufficient documentation

## 2013-11-19 MED ORDER — HYDROCODONE-ACETAMINOPHEN 5-325 MG PO TABS
1.0000 | ORAL_TABLET | Freq: Once | ORAL | Status: AC
  Administered 2013-11-19: 1 via ORAL
  Filled 2013-11-19: qty 1

## 2013-11-19 MED ORDER — HYDROCODONE-ACETAMINOPHEN 5-325 MG PO TABS: 2.0000 | ORAL_TABLET | ORAL | Status: DC | PRN

## 2013-11-19 NOTE — Discharge Instructions (Signed)

## 2013-11-19 NOTE — ED Provider Notes (Signed)
CSN: [id number]     Arrival date & time 11/19/13  1618 History  This chart was scribed for Fayrene HelperBowie Ayleen Mckinstry, PA working with Leonette Mostharles B. Bernette MayersSheldon, MD by Elveria Risingimelie Horne, ED Scribe. This patient was seen in room TR04C/TR04C and the patient's care was started at 5:23 PM.    Chief Complaint  Patient presents with  . Dental Pain   The history is provided by the patient. A language interpreter was used.   HPI Comments: [name] is a 28 y.o. male who presents to the Emergency Department complaining of severe, progressively worsening dental pain to a right lower tooth. Patient visited ED 2 weeks ago and received Tramadol and an antibiotic but has experienced no relief with the Tramadol. Patient says he was advised to follow up with a dentist but he was unable to due to work obligations. Patient's mother attempted scheduling a dental appointment for him, but she was told he would need another referral. Patient shares that his dental pain is worsened with chewing. Patient states his pain is due to a tooth he broke over a year ago which has been intermittently bothering him for the last year. Patient desires a referral to have tooth removed.    Past Medical History  Diagnosis Date  . Varicose veins   . Leg pain   . Ulcer    History reviewed. No pertinent past surgical history. Family History  Problem Relation Age of Onset  . Depression Mother   . Diabetes Mother   . Hypertension Mother   . Heart disease Father     before age 26   History  Substance Use Topics  . Smoking status: Current Every Day Smoker -- 0.50 packs/day for 10 years    Types: Cigarettes  . Smokeless tobacco: Not on file  . Alcohol Use: 1.2 oz/week    2 Cans of beer per week     Comment: very rarely    Review of Systems  Constitutional: Negative for fever.  HENT: Positive for dental problem.     Allergies  Review of patient's allergies indicates no known allergies.  Home Medications   Current Outpatient Rx  Name   Route  Sig  Dispense  Refill  . traMADol (ULTRAM) 50 MG tablet   Oral   Take 1 tablet (50 mg total) by mouth every 6 (six) hours as needed.   15 tablet   0    BP 146/81  Pulse 96  Temp(Src) 97.5 F (36.4 C) (Oral)  Resp 18  Wt 182 lb (82.555 kg)  SpO2 98% Physical Exam  Nursing note and vitals reviewed. Constitutional: He is oriented to person, place, and time. He appears well-developed and well-nourished. No distress.  HENT:  Head: Normocephalic.  Significant dental decay to tooth #31. No dental abscess. No trismus. No infection to floor of the mouth.   Eyes: EOM are normal.  Neck: Neck supple. No tracheal deviation present.  Cardiovascular: Normal rate.   Pulmonary/Chest: Effort normal. No respiratory distress.  Musculoskeletal: Normal range of motion.  Neurological: He is alert and oriented to person, place, and time.  Skin: Skin is warm and dry.  Psychiatric: He has a normal mood and affect. His behavior is normal.    ED Course  Procedures (including critical care time) DIAGNOSTIC STUDIES: Oxygen Saturation is 98% on room air, normal by my interpretation.    COORDINATION OF CARE: 5:27 PM- Will order Vicodin to aid in relieving dental pain. Pt advised of plan for treatment and  pt agrees. Dental referral given.     Labs Review Labs Reviewed - No data to display Imaging Review No results found.  EKG Interpretation   None       MDM   1. Pain due to dental caries    BP 146/81  Pulse 96  Temp(Src) 97.5 F (36.4 C) (Oral)  Resp 18  Wt 182 lb (82.555 kg)  SpO2 98%   I personally performed the services described in this documentation, which was scribed in my presence. The recorded information has been reviewed and is accurate.     Fayrene Helper, PA-C 11/19/13 1745

## 2013-11-19 NOTE — ED Notes (Signed)
See pas note he saw before myself

## 2013-11-19 NOTE — ED Provider Notes (Signed)
Medical screening examination/treatment/procedure(s) were performed by non-physician practitioner and as supervising physician I was immediately available for consultation/collaboration.  EKG Interpretation   None         Kerigan Narvaez B. Itzia Cunliffe, MD 11/19/13 1928 

## 2013-11-19 NOTE — ED Notes (Signed)
States he was here last week for same complaint. States his tooth broke off and causing him severe pain. Was given tramadol with no relief

## 2013-12-25 ENCOUNTER — Emergency Department (HOSPITAL_COMMUNITY): Payer: No Typology Code available for payment source

## 2013-12-25 ENCOUNTER — Observation Stay (HOSPITAL_COMMUNITY)
Admission: EM | Admit: 2013-12-25 | Discharge: 2013-12-28 | Disposition: A | Discharge status: Home / Self Care | Payer: No Typology Code available for payment source | Attending: Internal Medicine | Admitting: Internal Medicine

## 2013-12-25 ENCOUNTER — Encounter (HOSPITAL_COMMUNITY): Payer: Self-pay | Admitting: Emergency Medicine

## 2013-12-25 DIAGNOSIS — S32309A Unspecified fracture of unspecified ilium, initial encounter for closed fracture: Principal | ICD-10-CM

## 2013-12-25 DIAGNOSIS — T07XXXA Unspecified multiple injuries, initial encounter: Secondary | ICD-10-CM

## 2013-12-25 DIAGNOSIS — F411 Generalized anxiety disorder: Secondary | ICD-10-CM | POA: Insufficient documentation

## 2013-12-25 DIAGNOSIS — F17200 Nicotine dependence, unspecified, uncomplicated: Secondary | ICD-10-CM | POA: Insufficient documentation

## 2013-12-25 DIAGNOSIS — IMO0002 Reserved for concepts with insufficient information to code with codable children: Secondary | ICD-10-CM | POA: Insufficient documentation

## 2013-12-25 DIAGNOSIS — I8390 Asymptomatic varicose veins of unspecified lower extremity: Secondary | ICD-10-CM | POA: Insufficient documentation

## 2013-12-25 DIAGNOSIS — I83009 Varicose veins of unspecified lower extremity with ulcer of unspecified site: Secondary | ICD-10-CM

## 2013-12-25 DIAGNOSIS — E162 Hypoglycemia, unspecified: Secondary | ICD-10-CM

## 2013-12-25 DIAGNOSIS — L97909 Non-pressure chronic ulcer of unspecified part of unspecified lower leg with unspecified severity: Secondary | ICD-10-CM

## 2013-12-25 DIAGNOSIS — S32301A Unspecified fracture of right ilium, initial encounter for closed fracture: Secondary | ICD-10-CM

## 2013-12-25 DIAGNOSIS — D72829 Elevated white blood cell count, unspecified: Secondary | ICD-10-CM

## 2013-12-25 DIAGNOSIS — I83893 Varicose veins of bilateral lower extremities with other complications: Secondary | ICD-10-CM

## 2013-12-25 LAB — CBC WITH DIFFERENTIAL/PLATELET
Basophils Absolute: 0 10*3/uL (ref 0.0–0.1)
Basophils Relative: 0 % (ref 0–1)
Eosinophils Absolute: 0.1 10*3/uL (ref 0.0–0.7)
Eosinophils Relative: 0 % (ref 0–5)
HCT: 41.8 % (ref 39.0–52.0)
Hemoglobin: 14.4 g/dL (ref 13.0–17.0)
Lymphocytes Relative: 6 % — ABNORMAL LOW (ref 12–46)
Lymphs Abs: 0.9 10*3/uL (ref 0.7–4.0)
MCH: 31.6 pg (ref 26.0–34.0)
MCHC: 34.4 g/dL (ref 30.0–36.0)
MCV: 91.7 fL (ref 78.0–100.0)
Monocytes Absolute: 0.8 10*3/uL (ref 0.1–1.0)
Monocytes Relative: 5 % (ref 3–12)
Neutro Abs: 13.8 10*3/uL — ABNORMAL HIGH (ref 1.7–7.7)
Neutrophils Relative %: 89 % — ABNORMAL HIGH (ref 43–77)
Platelets: 205 10*3/uL (ref 150–400)
RBC: 4.56 MIL/uL (ref 4.22–5.81)
RDW: 11.9 % (ref 11.5–15.5)
WBC: 15.6 10*3/uL — ABNORMAL HIGH (ref 4.0–10.5)

## 2013-12-25 LAB — I-STAT CHEM 8, ED
BUN: 10 mg/dL (ref 6–23)
Calcium, Ion: 1.2 mmol/L (ref 1.12–1.23)
Chloride: 103 mEq/L (ref 96–112)
Creatinine, Ser: 1.2 mg/dL (ref 0.50–1.35)
Glucose, Bld: 95 mg/dL (ref 70–99)
HCT: 44 % (ref 39.0–52.0)
Hemoglobin: 15 g/dL (ref 13.0–17.0)
Potassium: 4.3 mEq/L (ref 3.7–5.3)
Sodium: 141 mEq/L (ref 137–147)
TCO2: 26 mmol/L (ref 0–100)

## 2013-12-25 MED ORDER — ONDANSETRON HCL 4 MG/2ML IJ SOLN
4.0000 mg | Freq: Once | INTRAMUSCULAR | Status: AC
  Administered 2013-12-25: 4 mg via INTRAVENOUS
  Filled 2013-12-25: qty 2

## 2013-12-25 MED ORDER — HEPARIN SODIUM (PORCINE) 5000 UNIT/ML IJ SOLN
5000.0000 [IU] | Freq: Three times a day (TID) | INTRAMUSCULAR | Status: DC
  Administered 2013-12-26 – 2013-12-28 (×7): 5000 [IU] via SUBCUTANEOUS
  Filled 2013-12-25 (×10): qty 1

## 2013-12-25 MED ORDER — TETANUS-DIPHTH-ACELL PERTUSSIS 5-2.5-18.5 LF-MCG/0.5 IM SUSP
0.5000 mL | Freq: Once | INTRAMUSCULAR | Status: AC
  Administered 2013-12-25: 0.5 mL via INTRAMUSCULAR
  Filled 2013-12-25: qty 0.5

## 2013-12-25 MED ORDER — MORPHINE SULFATE 4 MG/ML IJ SOLN
4.0000 mg | Freq: Once | INTRAMUSCULAR | Status: AC
Start: 2013-12-25 — End: 2013-12-25
  Administered 2013-12-25: 4 mg via INTRAVENOUS
  Filled 2013-12-25: qty 1

## 2013-12-25 MED ORDER — SODIUM CHLORIDE 0.9 % IV SOLN
INTRAVENOUS | Status: DC
  Administered 2013-12-26: 20 mL/h via INTRAVENOUS

## 2013-12-25 MED ORDER — MORPHINE SULFATE 4 MG/ML IJ SOLN
4.0000 mg | Freq: Once | INTRAMUSCULAR | Status: AC
  Administered 2013-12-25: 4 mg via INTRAVENOUS
  Filled 2013-12-25: qty 1

## 2013-12-25 MED ORDER — SENNA 8.6 MG PO TABS
1.0000 | ORAL_TABLET | Freq: Two times a day (BID) | ORAL | Status: DC
  Administered 2013-12-26 – 2013-12-28 (×5): 8.6 mg via ORAL
  Filled 2013-12-25 (×6): qty 1

## 2013-12-25 MED ORDER — HYDROMORPHONE HCL PF 1 MG/ML IJ SOLN
1.0000 mg | Freq: Once | INTRAMUSCULAR | Status: AC
  Administered 2013-12-25: 1 mg via INTRAVENOUS
  Filled 2013-12-25: qty 1

## 2013-12-25 MED ORDER — IOHEXOL 300 MG/ML  SOLN
100.0000 mL | Freq: Once | INTRAMUSCULAR | Status: AC | PRN
  Administered 2013-12-25: 100 mL via INTRAVENOUS

## 2013-12-25 MED ORDER — HYDROMORPHONE HCL PF 1 MG/ML IJ SOLN
1.0000 mg | INTRAMUSCULAR | Status: DC | PRN
  Administered 2013-12-26 – 2013-12-27 (×7): 2 mg via INTRAVENOUS
  Administered 2013-12-27: 1 mg via INTRAVENOUS
  Administered 2013-12-27 – 2013-12-28 (×7): 2 mg via INTRAVENOUS
  Filled 2013-12-25 (×10): qty 2
  Filled 2013-12-25: qty 1
  Filled 2013-12-25 (×6): qty 2

## 2013-12-25 NOTE — ED Notes (Signed)
Pt now complains of right rib pain. Peter PA aware and at bedside to assess. Right ribs tender on palpation, not obvious deformity noted

## 2013-12-25 NOTE — ED Notes (Signed)
Admitting MD at bedside.

## 2013-12-25 NOTE — ED Notes (Signed)
Pt returned from CT, reports increased pain.
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
## 2013-12-26 DIAGNOSIS — D72829 Elevated white blood cell count, unspecified: Secondary | ICD-10-CM

## 2013-12-26 DIAGNOSIS — E162 Hypoglycemia, unspecified: Secondary | ICD-10-CM

## 2013-12-26 LAB — GLUCOSE, CAPILLARY: Glucose-Capillary: 120 mg/dL — ABNORMAL HIGH (ref 70–99)

## 2013-12-26 LAB — COMPREHENSIVE METABOLIC PANEL
ALT: 22 U/L (ref 0–53)
AST: 41 U/L — ABNORMAL HIGH (ref 0–37)
Albumin: 3.8 g/dL (ref 3.5–5.2)
Alkaline Phosphatase: 63 U/L (ref 39–117)
BUN: 8 mg/dL (ref 6–23)
CO2: 25 mEq/L (ref 19–32)
Calcium: 8.7 mg/dL (ref 8.4–10.5)
Chloride: 101 mEq/L (ref 96–112)
Creatinine, Ser: 1.06 mg/dL (ref 0.50–1.35)
GFR calc Af Amer: >90 mL/min (ref >90)
GFR calc non Af Amer: >90 mL/min (ref >90)
Glucose, Bld: 63 mg/dL — ABNORMAL LOW (ref 70–99)
Potassium: 3.7 mEq/L (ref 3.7–5.3)
Sodium: 138 mEq/L (ref 137–147)
Total Bilirubin: 0.7 mg/dL (ref 0.3–1.2)
Total Protein: 7 g/dL (ref 6.0–8.3)

## 2013-12-26 LAB — CBC
HCT: 39 % (ref 39.0–52.0)
Hemoglobin: 13.6 g/dL (ref 13.0–17.0)
MCH: 31.9 pg (ref 26.0–34.0)
MCHC: 34.9 g/dL (ref 30.0–36.0)
MCV: 91.5 fL (ref 78.0–100.0)
Platelets: 201 10*3/uL (ref 150–400)
RBC: 4.26 MIL/uL (ref 4.22–5.81)
RDW: 12 % (ref 11.5–15.5)
WBC: 10.3 10*3/uL (ref 4.0–10.5)

## 2013-12-26 MED ORDER — ONDANSETRON HCL 4 MG/2ML IJ SOLN
INTRAMUSCULAR | Status: AC
  Filled 2013-12-26: qty 2

## 2013-12-26 MED ORDER — METHOCARBAMOL 500 MG PO TABS
500.0000 mg | ORAL_TABLET | Freq: Three times a day (TID) | ORAL | Status: DC | PRN
  Administered 2013-12-26 – 2013-12-27 (×3): 500 mg via ORAL
  Filled 2013-12-26 (×4): qty 1

## 2013-12-26 MED ORDER — OXYCODONE-ACETAMINOPHEN 5-325 MG PO TABS
1.0000 | ORAL_TABLET | Freq: Four times a day (QID) | ORAL | Status: DC | PRN
  Administered 2013-12-26 – 2013-12-27 (×4): 1 via ORAL
  Filled 2013-12-26 (×4): qty 1

## 2013-12-26 MED ORDER — ONDANSETRON HCL 4 MG/2ML IJ SOLN
4.0000 mg | Freq: Four times a day (QID) | INTRAMUSCULAR | Status: DC | PRN
  Administered 2013-12-26 – 2013-12-28 (×4): 4 mg via INTRAVENOUS
  Filled 2013-12-26 (×4): qty 2

## 2013-12-26 NOTE — Progress Notes (Signed)
Patient was unable to stand OOB today, despite encouragement from nursing staff and therapy, pain medicines and ice packs. Patient's pain has been the biggest issue. He was unable to void while lying in bed and could not stand; he began c/o a full bladder feeling and the urge to void. Bladder scan was done 900cc; In&Out cath performed at 17:00 and 1500cc removed. Patient felt much better. Will continue to monitor.

## 2013-12-26 NOTE — Progress Notes (Signed)
PT Cancellation Note  Patient Details Name: Douglas Morgan MRN: [id number] DOB: 09/23/1986   Cancelled Treatment:      Attempted to evaluate pt. He asked to be assisted to seated position at EOB to urinate but was unable to bring RLE into a position of comfort, and insisted he lie back in bed again. He is unable to work with PT at this time. Will attempt to perform full evaluation tomorrow when pain is better controlled.  [address]Sharie Amorin Secor [place], [place]PT [phone]  [place], Douglas Morgan 12/26/2013, 4:25 PM
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
## 2013-12-26 NOTE — Progress Notes (Signed)
UR completed 
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
## 2013-12-27 DIAGNOSIS — E162 Hypoglycemia, unspecified: Secondary | ICD-10-CM

## 2013-12-27 DIAGNOSIS — IMO0002 Reserved for concepts with insufficient information to code with codable children: Secondary | ICD-10-CM

## 2013-12-27 DIAGNOSIS — D72829 Elevated white blood cell count, unspecified: Secondary | ICD-10-CM

## 2013-12-27 LAB — CBC
HCT: 38.6 % — ABNORMAL LOW (ref 39.0–52.0)
Hemoglobin: 12.8 g/dL — ABNORMAL LOW (ref 13.0–17.0)
MCH: 31.2 pg (ref 26.0–34.0)
MCHC: 33.2 g/dL (ref 30.0–36.0)
MCV: 94.1 fL (ref 78.0–100.0)
Platelets: 183 10*3/uL (ref 150–400)
RBC: 4.1 MIL/uL — ABNORMAL LOW (ref 4.22–5.81)
RDW: 12.3 % (ref 11.5–15.5)
WBC: 7.1 10*3/uL (ref 4.0–10.5)

## 2013-12-27 MED ORDER — METHOCARBAMOL 500 MG PO TABS
500.0000 mg | ORAL_TABLET | Freq: Four times a day (QID) | ORAL | Status: DC
  Administered 2013-12-27 – 2013-12-28 (×4): 500 mg via ORAL
  Filled 2013-12-27 (×8): qty 1

## 2013-12-27 MED ORDER — OXYCODONE-ACETAMINOPHEN 5-325 MG PO TABS
2.0000 | ORAL_TABLET | Freq: Four times a day (QID) | ORAL | Status: DC | PRN
Start: 2013-12-27 — End: 2013-12-27

## 2013-12-27 MED ORDER — OXYCODONE-ACETAMINOPHEN 5-325 MG PO TABS
2.0000 | ORAL_TABLET | ORAL | Status: DC | PRN
  Administered 2013-12-27 – 2013-12-28 (×6): 2 via ORAL
  Filled 2013-12-27 (×6): qty 2

## 2013-12-27 MED ORDER — LORAZEPAM 1 MG PO TABS
1.0000 mg | ORAL_TABLET | Freq: Four times a day (QID) | ORAL | Status: DC | PRN
  Administered 2013-12-27 – 2013-12-28 (×3): 1 mg via ORAL
  Filled 2013-12-27 (×3): qty 1
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
## 2013-12-27 NOTE — Progress Notes (Signed)
Patient has not voided. Bladder scan showed >78750ml. Second In and Out cath done at 0145 with 950ml of urine out. Per patient he has no urge to urinate nor does he fill full or any discomfort. Patient stated he has never had any problem peeing and doesn't know why he can't pee. Urinal at bedside, patient resting comfortably, will continue to monitor patient.
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
## 2013-12-27 NOTE — Progress Notes (Signed)
Subjective:     Patient reports pain as 7 on 0-10 scale.    Objective: Vital signs in last 24 hours: Temp:  [98 F (36.7 C)-98.1 F (36.7 C)] 98 F (36.7 C) (03/11 0004) Pulse Rate:  [68-72] 72 (03/11 0004) Resp:  [18] 18 (03/11 0004) BP: (115-136)/(60-75) 115/60 mmHg (03/11 0004) SpO2:  [98 %-99 %] 99 % (03/11 0004)  Intake/Output from previous day: 03/10 0701 - 03/11 0700 In: 2480 [P.O.:2400; I.V.:80] Out: 2450 [Urine:2450] Intake/Output this shift:     Recent Labs  12/25/13 2145 12/25/13 2151 12/26/13 0420 12/27/13 0555  HGB 14.4 15.0 13.6 12.8*    Recent Labs  12/26/13 0420 12/27/13 0555  WBC 10.3 7.1  RBC 4.26 4.10*  HCT 39.0 38.6*  PLT 201 183    Recent Labs  12/25/13 2151 12/26/13 0420  NA 141 138  K 4.3 3.7  CL 103 101  CO2  --  25  BUN 10 8  CREATININE 1.20 1.06  GLUCOSE 95 63*  CALCIUM  --  8.7   No results found for this basename: LABPT, INR,  in the last 72 hours  Neurologically intact  Assessment/Plan:     Up with therapy  Once he can safely get OOB to bathroom, chair with crutches he can be discharged with followup with me in 2 wks.   Panda Crossin C 12/27/2013, 7:36 AM

## 2013-12-27 NOTE — Progress Notes (Signed)
Rept to Dr. Isidoro Donningai. Pt complains of untolerable pain and has required 2 in and out cath's since admission. Pt bladder scanned for 700 cc. Indwelling foley placed due to pt's inability to void. Order's received. Will continue to monitor.

## 2013-12-27 NOTE — Progress Notes (Signed)
Dr. Isidoro Donningai changed pt's pain regimen today. Pt was able to get up to chair and back to bed with one assist. Pt repts that his pain is "better" today related to regimen change. Pt has even had episodes that he was able to sleep in between pain medication doses. Will continue to monitor.

## 2013-12-27 NOTE — ED Provider Notes (Signed)
Medical screening examination/treatment/procedure(s) were performed by non-physician practitioner and as supervising physician I was immediately available for consultation/collaboration.   EKG Interpretation None       Juliet RudeNathan R. Rubin PayorPickering, MD 12/27/13 [phone]

## 2013-12-28 MED ORDER — METHOCARBAMOL 500 MG PO TABS: 500.0000 mg | ORAL_TABLET | Freq: Four times a day (QID) | ORAL | Status: DC | PRN

## 2013-12-28 MED ORDER — SENNA 8.6 MG PO TABS: 1.0000 | ORAL_TABLET | Freq: Two times a day (BID) | ORAL | Status: DC

## 2013-12-28 MED ORDER — NAPROXEN SODIUM 220 MG PO TABS: 220.0000 mg | ORAL_TABLET | Freq: Two times a day (BID) | ORAL | Status: DC

## 2013-12-28 MED ORDER — OXYCODONE-ACETAMINOPHEN 5-325 MG PO TABS: 2.0000 | ORAL_TABLET | ORAL | Status: DC | PRN

## 2013-12-28 MED ORDER — LORAZEPAM 1 MG PO TABS: 1.0000 mg | ORAL_TABLET | Freq: Three times a day (TID) | ORAL | Status: DC | PRN
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
## 2014-01-02 ENCOUNTER — Emergency Department (HOSPITAL_COMMUNITY)
Admission: EM | Admit: 2014-01-02 | Discharge: 2014-01-03 | Disposition: A | Discharge status: Home / Self Care | Payer: No Typology Code available for payment source | Attending: Emergency Medicine | Admitting: Emergency Medicine

## 2014-01-02 ENCOUNTER — Encounter (HOSPITAL_COMMUNITY): Payer: Self-pay | Admitting: Emergency Medicine

## 2014-01-02 ENCOUNTER — Emergency Department (HOSPITAL_COMMUNITY): Payer: No Typology Code available for payment source

## 2014-01-02 DIAGNOSIS — Z79899 Other long term (current) drug therapy: Secondary | ICD-10-CM | POA: Insufficient documentation

## 2014-01-02 DIAGNOSIS — S2249XA Multiple fractures of ribs, unspecified side, initial encounter for closed fracture: Secondary | ICD-10-CM | POA: Insufficient documentation

## 2014-01-02 DIAGNOSIS — F17200 Nicotine dependence, unspecified, uncomplicated: Secondary | ICD-10-CM | POA: Insufficient documentation

## 2014-01-02 DIAGNOSIS — S2241XA Multiple fractures of ribs, right side, initial encounter for closed fracture: Secondary | ICD-10-CM

## 2014-01-02 DIAGNOSIS — Z791 Long term (current) use of non-steroidal anti-inflammatories (NSAID): Secondary | ICD-10-CM | POA: Insufficient documentation

## 2014-01-02 DIAGNOSIS — Z872 Personal history of diseases of the skin and subcutaneous tissue: Secondary | ICD-10-CM | POA: Insufficient documentation

## 2014-01-02 DIAGNOSIS — Z8679 Personal history of other diseases of the circulatory system: Secondary | ICD-10-CM | POA: Insufficient documentation

## 2014-01-02 LAB — CBC
HCT: 39.8 % (ref 39.0–52.0)
Hemoglobin: 13.8 g/dL (ref 13.0–17.0)
MCH: 32.2 pg (ref 26.0–34.0)
MCHC: 34.7 g/dL (ref 30.0–36.0)
MCV: 93 fL (ref 78.0–100.0)
Platelets: 265 10*3/uL (ref 150–400)
RBC: 4.28 MIL/uL (ref 4.22–5.81)
RDW: 12.4 % (ref 11.5–15.5)
WBC: 6.8 10*3/uL (ref 4.0–10.5)

## 2014-01-02 LAB — BASIC METABOLIC PANEL
BUN: 12 mg/dL (ref 6–23)
CO2: 26 mEq/L (ref 19–32)
Calcium: 9.3 mg/dL (ref 8.4–10.5)
Chloride: 101 mEq/L (ref 96–112)
Creatinine, Ser: 0.88 mg/dL (ref 0.50–1.35)
GFR calc Af Amer: >90 mL/min (ref >90)
GFR calc non Af Amer: >90 mL/min (ref >90)
Glucose, Bld: 99 mg/dL (ref 70–99)
Potassium: 4.4 mEq/L (ref 3.7–5.3)
Sodium: 140 mEq/L (ref 137–147)

## 2014-01-02 LAB — I-STAT TROPONIN, ED: Troponin i, poc: 0 ng/mL (ref 0.00–0.08)

## 2014-01-02 LAB — PRO B NATRIURETIC PEPTIDE: Pro B Natriuretic peptide (BNP): 15 pg/mL (ref 0–125)

## 2014-01-02 MED ORDER — MORPHINE SULFATE 4 MG/ML IJ SOLN
4.0000 mg | Freq: Once | INTRAMUSCULAR | Status: AC
  Administered 2014-01-03: 4 mg via INTRAVENOUS
  Filled 2014-01-02: qty 1

## 2014-01-02 NOTE — ED Notes (Signed)
Nurse First Rounds : Nurse explained delay , wait time and process to pt. , Respirations unlabored / no distress.
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
## 2014-01-03 ENCOUNTER — Emergency Department (HOSPITAL_COMMUNITY): Payer: No Typology Code available for payment source

## 2014-01-03 MED ORDER — IOHEXOL 350 MG/ML SOLN
100.0000 mL | Freq: Once | INTRAVENOUS | Status: AC | PRN
  Administered 2014-01-03: 90 mL via INTRAVENOUS

## 2014-01-03 MED ORDER — NAPROXEN 500 MG PO TABS: 500.0000 mg | ORAL_TABLET | Freq: Two times a day (BID) | ORAL | Status: DC

## 2014-01-03 MED ORDER — OXYCODONE-ACETAMINOPHEN 5-325 MG PO TABS
2.0000 | ORAL_TABLET | Freq: Once | ORAL | Status: AC
  Administered 2014-01-03: 2 via ORAL
  Filled 2014-01-03: qty 2

## 2014-01-03 MED ORDER — OXYCODONE-ACETAMINOPHEN 5-325 MG PO TABS: 2.0000 | ORAL_TABLET | ORAL | Status: DC | PRN

## 2014-01-03 NOTE — Discharge Instructions (Signed)
Please call your doctor for a followup appointment within 24-48 hours. When you talk to your doctor please let them know that you were seen in the emergency department and have them acquire all of your records so that they can discuss the findings with you and formulate a treatment plan to fully care for your new and ongoing problems. ° °

## 2014-02-26 ENCOUNTER — Emergency Department (HOSPITAL_COMMUNITY)
Admission: EM | Admit: 2014-02-26 | Discharge: 2014-02-26 | Disposition: A | Discharge status: Home / Self Care | Payer: MEDICAID | Attending: Emergency Medicine | Admitting: Emergency Medicine

## 2014-02-26 ENCOUNTER — Encounter (HOSPITAL_COMMUNITY): Payer: Self-pay | Admitting: Emergency Medicine

## 2014-02-26 DIAGNOSIS — Z79899 Other long term (current) drug therapy: Secondary | ICD-10-CM | POA: Insufficient documentation

## 2014-02-26 DIAGNOSIS — F17200 Nicotine dependence, unspecified, uncomplicated: Secondary | ICD-10-CM | POA: Insufficient documentation

## 2014-02-26 DIAGNOSIS — K029 Dental caries, unspecified: Secondary | ICD-10-CM

## 2014-02-26 DIAGNOSIS — Z872 Personal history of diseases of the skin and subcutaneous tissue: Secondary | ICD-10-CM | POA: Insufficient documentation

## 2014-02-26 DIAGNOSIS — Z8679 Personal history of other diseases of the circulatory system: Secondary | ICD-10-CM | POA: Insufficient documentation

## 2014-02-26 DIAGNOSIS — Z791 Long term (current) use of non-steroidal anti-inflammatories (NSAID): Secondary | ICD-10-CM | POA: Insufficient documentation

## 2014-02-26 MED ORDER — OXYCODONE-ACETAMINOPHEN 5-325 MG PO TABS
1.0000 | ORAL_TABLET | Freq: Once | ORAL | Status: AC
  Administered 2014-02-26: 1 via ORAL
  Filled 2014-02-26: qty 1

## 2014-02-26 MED ORDER — OXYCODONE-ACETAMINOPHEN 5-325 MG PO TABS: 1.0000 | ORAL_TABLET | Freq: Four times a day (QID) | ORAL | Status: DC | PRN

## 2014-02-26 MED ORDER — IBUPROFEN 800 MG PO TABS: 800.0000 mg | ORAL_TABLET | Freq: Three times a day (TID) | ORAL | Status: DC

## 2014-02-26 MED ORDER — PENICILLIN V POTASSIUM 500 MG PO TABS: 500.0000 mg | ORAL_TABLET | Freq: Three times a day (TID) | ORAL | Status: DC

## 2014-02-26 NOTE — ED Provider Notes (Signed)
CSN: [id number]     Arrival date & time 02/26/14  1604 History  This chart was scribed for non-physician practitioner Felicie Mornavid Juanjesus Pepperman, NP working with Gerhard Munchobert Lockwood, MD by Dorothey Basemania Sutton, ED Scribe. This patient was seen in room TR09C/TR09C and the patient's care was started at 5:24 PM.    Chief Complaint  Patient presents with  . Dental Pain   Patient is a 28 y.o. male presenting with tooth pain. The history is provided by the patient. No language interpreter was used.  Dental Pain Location:  Upper Severity:  Moderate Onset quality:  Sudden Timing:  Constant Progression:  Unchanged Chronicity:  New Context: dental fracture   Relieved by:  Nothing Ineffective treatments:  None tried Associated symptoms: facial swelling   Associated symptoms: no fever   Risk factors: smoking    HPI Comments: Douglas Morgan is a 28 y.o. male who presents to the Emergency Department complaining of a constant pain to the left upper dentition secondary to cracking a tooth in the area yesterday. He reports some associated left-sided facial swelling. He states that he has been referred to a dentist (Dr. Mayford Knifeurner) in the past, who referred him to an oral surgeon. He denies fever. He denies any allergies to medications. Patient is a current every day smoker, 0.5 PPD. Patient has no other pertinent medical history.   Past Medical History  Diagnosis Date  . Varicose veins   . Leg pain   . Ulcer    History reviewed. No pertinent past surgical history. Family History  Problem Relation Age of Onset  . Depression Mother   . Diabetes Mother   . Hypertension Mother   . Heart disease Father     before age 26   History  Substance Use Topics  . Smoking status: Current Every Day Smoker -- 0.50 packs/day for 10 years    Types: Cigarettes  . Smokeless tobacco: Not on file  . Alcohol Use: 1.2 oz/week    2 Cans of beer per week     Comment: very rarely    Review of Systems  Constitutional: Negative for fever.   HENT: Positive for dental problem and facial swelling.   All other systems reviewed and are negative.  Allergies  Review of patient's allergies indicates no known allergies.  Home Medications   Prior to Admission medications   Medication Sig Start Date End Date Taking? Authorizing Provider  acetaminophen (TYLENOL) 500 MG tablet Take 1,000 mg by mouth every 6 (six) hours as needed for moderate pain.    Historical Provider, MD  LORazepam (ATIVAN) 1 MG tablet Take 1 tablet (1 mg total) by mouth every 8 (eight) hours as needed for anxiety. 12/28/13   Ripudeep Jenna LuoK Rai, MD  methocarbamol (ROBAXIN) 500 MG tablet Take 1 tablet (500 mg total) by mouth every 6 (six) hours as needed for muscle spasms. 12/28/13   Ripudeep Jenna LuoK Rai, MD  naproxen (NAPROSYN) 500 MG tablet Take 1 tablet (500 mg total) by mouth 2 (two) times daily with a meal. 01/03/14   Vida RollerBrian D Miller, MD  naproxen sodium (ANAPROX) 220 MG tablet Take 1 tablet (220 mg total) by mouth 2 (two) times daily with a meal. 12/28/13   Ripudeep Jenna LuoK Rai, MD  oxyCODONE-acetaminophen (PERCOCET/ROXICET) 5-325 MG per tablet Take 2 tablets by mouth every 4 (four) hours as needed for moderate pain or severe pain. 12/28/13   Ripudeep Jenna LuoK Rai, MD  oxyCODONE-acetaminophen (PERCOCET/ROXICET) 5-325 MG per tablet Take 2 tablets by mouth every 4 (  four) hours as needed for severe pain. 01/03/14   Vida RollerBrian D Miller, MD  senna (SENOKOT) 8.6 MG TABS tablet Take 1 tablet (8.6 mg total) by mouth 2 (two) times daily. For constipation 12/28/13   Ripudeep Jenna LuoK Rai, MD   Triage Vitals: BP 153/83  Pulse 91  Temp(Src) 98.3 F (36.8 C)  Resp 18  SpO2 98%  Physical Exam  Nursing note and vitals reviewed. Constitutional: He is oriented to person, place, and time. He appears well-developed and well-nourished. No distress.  HENT:  Head: Normocephalic and atraumatic.  Mouth/Throat:    3rd and 4th tooth on the upper left side are broken down to the gumline. Localized swelling around the area.    Eyes: Conjunctivae are normal.  Neck: Normal range of motion. Neck supple.  Cardiovascular: Normal rate, regular rhythm and normal heart sounds.   Pulmonary/Chest: Effort normal and breath sounds normal. No respiratory distress.  Abdominal: He exhibits no distension.  Musculoskeletal: Normal range of motion.  Neurological: He is alert and oriented to person, place, and time.  Skin: Skin is warm and dry.  Psychiatric: He has a normal mood and affect. His behavior is normal.    ED Course  Procedures (including critical care time)  DIAGNOSTIC STUDIES: Oxygen Saturation is 98% on room air, normal by my interpretation.    COORDINATION OF CARE: 5:28 PM- Will discharge patient with antibiotics, anti-inflammatories, and a short course of pain medication for breakthrough pain. Advised him to follow up with his already established oral surgeon. Discussed treatment plan with patient at bedside and patient verbalized agreement.     Labs Review Labs Reviewed - No data to display  Imaging Review No results found.   EKG Interpretation None      MDM   Final diagnoses:  None    Dental pain.  I personally performed the services described in this documentation, which was scribed in my presence. The recorded information has been reviewed and is accurate.     Jimmye Normanavid John Alany Borman, NP 02/26/14 2239

## 2014-02-26 NOTE — ED Notes (Signed)
Pt here for left upper dental pain due to broken tooth.

## 2014-02-26 NOTE — Discharge Instructions (Signed)

## 2014-02-27 NOTE — ED Provider Notes (Signed)
  Medical screening examination/treatment/procedure(s) were performed by non-physician practitioner and as supervising physician I was immediately available for consultation/collaboration.   EKG Interpretation None         Carol Loftin, MD 02/27/14 0017 

## 2014-09-10 ENCOUNTER — Encounter (HOSPITAL_COMMUNITY): Payer: Self-pay | Admitting: Emergency Medicine

## 2014-09-10 ENCOUNTER — Emergency Department (HOSPITAL_COMMUNITY)
Admission: EM | Admit: 2014-09-10 | Discharge: 2014-09-10 | Disposition: A | Discharge status: Home / Self Care | Payer: MEDICAID | Attending: Emergency Medicine | Admitting: Emergency Medicine

## 2014-09-10 DIAGNOSIS — Z792 Long term (current) use of antibiotics: Secondary | ICD-10-CM | POA: Insufficient documentation

## 2014-09-10 DIAGNOSIS — K088 Other specified disorders of teeth and supporting structures: Secondary | ICD-10-CM | POA: Insufficient documentation

## 2014-09-10 DIAGNOSIS — Z791 Long term (current) use of non-steroidal anti-inflammatories (NSAID): Secondary | ICD-10-CM | POA: Insufficient documentation

## 2014-09-10 DIAGNOSIS — Z872 Personal history of diseases of the skin and subcutaneous tissue: Secondary | ICD-10-CM | POA: Insufficient documentation

## 2014-09-10 DIAGNOSIS — K0381 Cracked tooth: Secondary | ICD-10-CM | POA: Insufficient documentation

## 2014-09-10 DIAGNOSIS — K029 Dental caries, unspecified: Secondary | ICD-10-CM | POA: Insufficient documentation

## 2014-09-10 DIAGNOSIS — Z8679 Personal history of other diseases of the circulatory system: Secondary | ICD-10-CM | POA: Insufficient documentation

## 2014-09-10 DIAGNOSIS — Z720 Tobacco use: Secondary | ICD-10-CM | POA: Insufficient documentation

## 2014-09-10 MED ORDER — NAPROXEN 500 MG PO TABS: 500.0000 mg | ORAL_TABLET | Freq: Two times a day (BID) | ORAL | Status: DC | PRN

## 2014-09-10 MED ORDER — DOXYCYCLINE HYCLATE 100 MG PO CAPS: 100.0000 mg | ORAL_CAPSULE | Freq: Two times a day (BID) | ORAL | Status: DC

## 2014-09-10 MED ORDER — HYDROCODONE-ACETAMINOPHEN 5-325 MG PO TABS: 1.0000 | ORAL_TABLET | Freq: Four times a day (QID) | ORAL | Status: DC | PRN

## 2014-09-10 MED ORDER — HYDROCODONE-ACETAMINOPHEN 5-325 MG PO TABS
1.0000 | ORAL_TABLET | Freq: Once | ORAL | Status: AC
  Administered 2014-09-10: 1 via ORAL
  Filled 2014-09-10: qty 1

## 2014-09-10 NOTE — ED Notes (Signed)
Pt c/o pain in multi teeth on top,.   St's woke up this am with swelling to mouth.

## 2014-09-10 NOTE — ED Provider Notes (Signed)
CSN: [id number]     Arrival date & time 09/10/14  1511 History  This chart was scribed for non-physician practitioner, Allen DerryMercedes Camprubi-Soms, PA-C, working with Samuel JesterKathleen McManus, DO by Charline BillsEssence Howell, ED Scribe. This patient was seen in room TR07C/TR07C and the patient's care was started at 3:37 PM.   Chief Complaint  Patient presents with  . Dental Pain   Patient is a 28 y.o. male presenting with tooth pain. The history is provided by the patient. No language interpreter was used.  Dental Pain Location:  Upper Upper teeth location:  9/LU central incisor and 10/LU lateral incisor Quality:  Pulsating and throbbing Severity:  Moderate Onset quality:  Gradual Duration:  1 day Timing:  Constant Progression:  Worsening Chronicity:  New Context: dental fracture and poor dentition   Relieved by:  Nothing Worsened by:  Touching, pressure and cold food/drink Ineffective treatments:  Acetaminophen and NSAIDs Associated symptoms: facial swelling   Associated symptoms: no congestion, no difficulty swallowing, no drooling, no fever, no gum swelling, no headaches, no neck pain, no neck swelling, no oral bleeding, no oral lesions and no trismus   Risk factors: lack of dental care and smoking    HPI Comments: Douglas Morgan is a 28 y.o. male who presents to the Emergency Department complaining of L front dental pain in tooth #9-10 onset yesterday. Pt reports that he broke his tooth 2 days ago. He describes the pain as a constant, throbbing and pulsating sensation that he rates as 8/10, which is nonradiating. Pain is exacerbated by touching, pressure, and cold air. Pt has tried ASA without relief. He reports associated facial swelling. Pt denies drooling, trismus, gum drainage, gum swelling, neck swelling/pain, URI symptoms, rhinorrhea, sinus congestion, chest pain, SOB, abdominal pain, fever, nausea, vomiting, HA, or vision changes. Does not see a dentist regularly, smokes daily.     Past Medical  History  Diagnosis Date  . Varicose veins   . Leg pain   . Ulcer    History reviewed. No pertinent past surgical history. Family History  Problem Relation Age of Onset  . Depression Mother   . Diabetes Mother   . Hypertension Mother   . Heart disease Father     before age 26   History  Substance Use Topics  . Smoking status: Current Every Day Smoker -- 0.50 packs/day for 10 years    Types: Cigarettes  . Smokeless tobacco: Not on file  . Alcohol Use: 1.2 oz/week    2 Cans of beer per week     Comment: very rarely    Review of Systems  Constitutional: Negative for fever and chills.  HENT: Positive for dental problem and facial swelling. Negative for congestion, drooling, ear pain, mouth sores, rhinorrhea, sinus pressure, sore throat and trouble swallowing.   Eyes: Negative for pain and visual disturbance.  Respiratory: Negative for shortness of breath.   Cardiovascular: Negative for chest pain.  Gastrointestinal: Negative for nausea, vomiting and abdominal pain.  Musculoskeletal: Negative for myalgias, arthralgias and neck pain.  Skin: Negative for color change.  Neurological: Negative for weakness, numbness and headaches.  Hematological: Negative for adenopathy.  10 Systems reviewed and all are negative for acute change except as noted in the HPI.  Allergies  Review of patient's allergies indicates no known allergies.  Home Medications   Prior to Admission medications   Medication Sig Start Date End Date Taking? Authorizing Provider  ibuprofen (ADVIL,MOTRIN) 800 MG tablet Take 1 tablet (800 mg total) by mouth  3 (three) times daily. 02/26/14   Jimmye Norman, NP  oxyCODONE-acetaminophen (PERCOCET/ROXICET) 5-325 MG per tablet Take 1 tablet by mouth every 6 (six) hours as needed for severe pain. 02/26/14   Jimmye Norman, NP  penicillin v potassium (VEETID) 500 MG tablet Take 1 tablet (500 mg total) by mouth 3 (three) times daily. 02/26/14   Jimmye Norman, NP   Triage  Vitals: BP 137/77 mmHg  Pulse 110  Temp(Src) 98.3 F (36.8 C) (Oral)  Resp 18  Ht 6\' 5"  (1.956 m)  Wt 189 lb (85.73 kg)  BMI 22.41 kg/m2  SpO2 97% Physical Exam  Constitutional: He is oriented to person, place, and time. Vital signs are normal. He appears well-developed and well-nourished.  Non-toxic appearance. No distress.  Afebrile, nontoxic, mildly tachycardic which resolved  HENT:  Head: Normocephalic and atraumatic.  Nose: Nose normal.  Mouth/Throat: Uvula is midline, oropharynx is clear and moist and mucous membranes are normal. No oral lesions. No trismus in the jaw. Abnormal dentition. Dental caries present. No uvula swelling.    Mild upper lip swelling to L side without erythema or warmth. Diffusely poor oral dentita with multiple caries and decayed teeth. LU incisors #9-10 broken and decayed with TTP, no gingival swelling or abscess, no erythema. No appreciable abscess. No oral lesions to buccal mucosa. Oropharynx clear and moist, MMM, no trismus or drooling  Eyes: Conjunctivae and EOM are normal. Right eye exhibits no discharge. Left eye exhibits no discharge.  Neck: Normal range of motion. Neck supple.  Cardiovascular: Normal rate and intact distal pulses.   Initially tachycardic which resolved  Pulmonary/Chest: Effort normal. No respiratory distress.  Abdominal: Normal appearance. He exhibits no distension.  Musculoskeletal: Normal range of motion.  Lymphadenopathy:       Head (left side): Submandibular adenopathy present.  Mild submandibular LAD on L side  Neurological: He is alert and oriented to person, place, and time. He has normal strength. No sensory deficit.  Skin: Skin is warm, dry and intact. No rash noted. No erythema.  Psychiatric: He has a normal mood and affect. His behavior is normal.  Nursing note and vitals reviewed.  ED Course  Procedures (including critical care time) DIAGNOSTIC STUDIES: Oxygen Saturation is 97% on RA, normal by my  interpretation.    COORDINATION OF CARE: 3:42 PM-Discussed treatment plan which includes antibiotics, medication for pain, and follow-up with dentist with pt at bedside and pt agreed to plan.   Labs Review Labs Reviewed - No data to display  Imaging Review No results found.   EKG Interpretation None      MDM   Final diagnoses:  Pain due to dental caries  Dental decay    28y/o male with dental pain after fracturing tooth. Diffuse decay in multiple teeth, concerning for dental infection with patient afebrile, non toxic appearing and swallowing secretions well. Refused dental block. Will give vicodin for pain here. NCDB reveals only one prior rx for narcotics in June 2015. I gave patient referral to dentist and stressed the importance of dental follow up for ultimate management of dental pain.  I have also discussed reasons to return immediately to the ER.  Patient expresses understanding and agrees with plan.  I will also give doxycycline and pain control.    I personally performed the services described in this documentation, which was scribed in my presence. The recorded information has been reviewed and is accurate.  BP 137/77 mmHg  Pulse 110  Temp(Src) 98.3 F (36.8  C) (Oral)  Resp 18  Ht 6\' 5"  (1.956 m)  Wt 189 lb (85.73 kg)  BMI 22.41 kg/m2  SpO2 97%  Meds ordered this encounter  Medications  . HYDROcodone-acetaminophen (NORCO/VICODIN) 5-325 MG per tablet 1 tablet    Sig:   . HYDROcodone-acetaminophen (NORCO) 5-325 MG per tablet    Sig: Take 1-2 tablets by mouth every 6 (six) hours as needed for severe pain.    Dispense:  10 tablet    Refill:  0    Order Specific Question:  Supervising Provider    Answer:  Eber HongMILLER, BRIAN D [[id number]]  . naproxen (NAPROSYN) 500 MG tablet    Sig: Take 1 tablet (500 mg total) by mouth 2 (two) times daily as needed for mild pain, moderate pain or headache (TAKE WITH MEALS.).    Dispense:  20 tablet    Refill:  0    Order Specific  Question:  Supervising Provider    Answer:  Eber HongMILLER, BRIAN D [[id number]]  . doxycycline (VIBRAMYCIN) 100 MG capsule    Sig: Take 1 capsule (100 mg total) by mouth 2 (two) times daily. One po bid x 7 days    Dispense:  14 capsule    Refill:  0    Order Specific Question:  Supervising Provider    Answer:  Eber HongMILLER, BRIAN D [[id number]]      Donnita FallsMercedes Strupp Camprubi-Soms, PA-C 09/10/14 1601  Samuel JesterKathleen McManus, DO 09/11/14 1640

## 2014-09-10 NOTE — Discharge Instructions (Signed)
Apply warm compresses to jaw throughout the day. Take antibiotic until finished and avoid direct sunlight. Take naprosyn and norco as directed, as needed for pain but do not drive or operate machinery with pain medication use. Followup with a dentist is very important for ongoing evaluation and management of recurrent dental pain. Use the list below to find one or see the one you've been referred to above. Return to emergency department for emergent changing or worsening symptoms.   Emergency Department Resource Guide 1) Find a Doctor and Pay Out of Pocket Although you won't have to find out who is covered by your insurance plan, it is a good idea to ask around and get recommendations. You will then need to call the office and see if the doctor you have chosen will accept you as a new patient and what types of options they offer for patients who are self-pay. Some doctors offer discounts or will set up payment plans for their patients who do not have insurance, but you will need to ask so you aren't surprised when you get to your appointment.  2) Contact Your Local Health Department Not all health departments have doctors that can see patients for sick visits, but many do, so it is worth a call to see if yours does. If you don't know where your local health department is, you can check in your phone book. The CDC also has a tool to help you locate your state's health department, and many state websites also have listings of all of their local health departments.  3) Find a Walk-in Clinic If your illness is not likely to be very severe or complicated, you may want to try a walk in clinic. These are popping up all over the country in pharmacies, drugstores, and shopping centers. They're usually staffed by nurse practitioners or physician assistants that have been trained to treat common illnesses and complaints. They're usually fairly quick and inexpensive. However, if you have serious medical issues or  chronic medical problems, these are probably not your best option.  No Primary Care Doctor: - Call Health Connect at  [phone] - they can help you locate a primary care doctor that  accepts your insurance, provides certain services, etc. - Physician Referral Service- [phone]  Chronic Pain Problems: Organization         Address  Phone   Notes  [facility]  [phone] Patients need to be referred by their primary care doctor.   Medication Assistance: Organization         Address  Phone   Notes  [facility] [address] [place], [place] [id number] [phone] --Must be a resident of [facility] [place] -- Must have NO insurance coverage whatsoever (no Medicaid/ Medicare, etc.) -- The pt. MUST have a primary care doctor that directs their care regularly and follows them in the community   [facility]  [phone]   [employer]  [phone]     Dental Care: Organization         Address  Phone  Notes  [facility] [address], [place] [phone] Accepts children up to age 26 who are enrolled in [place] or [place] Health Choice; pregnant women with a Medicaid card; and children who have applied for Medicaid or [place] Health Choice, but were declined, whose parents can pay a reduced fee at time of service.  [facility]  [facility] [place]  [address], [place] [phone] Accepts children up to age 26 who are enrolled in Medicaid or [place] Health Choice; pregnant women with a Medicaid card; and children who have applied for Medicaid or [place] Health Choice, but were declined, whose parents can pay a reduced fee at time of service.  [facility]  [address], [place] [phone] Patients are seen by appointment only. Walk-ins are not accepted. [facility] will see patients 26 years of age  and older. Monday - Tuesday (8am-5pm) Most Wednesdays (8:30-5pm) $30 per visit, cash only  [facility]  [address], [facility] [phone] Patients are seen by appointment only. Walk-ins are not accepted. [facility] will see patients 26 years of age and older. One Wednesday Evening (Monthly: Volunteer Based).  $30 per visit, cash only  [facility]  [phone] for adults; Children under age 354, call Graduate Pediatric Dentistry at [phone]. Children aged 724-14, please call [phone] to request a pediatric application.  Dental services are provided in all areas of dental care including fillings, crowns and bridges, complete and partial dentures, implants, gum treatment, root canals, and extractions. Preventive care is also provided. Treatment is provided to both adults and children. Patients are selected via a lottery and there is often a waiting list.   [facility] [address], [place]  [phone] [email or web address]   [facility] [address], [place], [place] [phone], Ext. [phone] Second and Fourth Thursday of each month, opens at 6:30 AM; Clinic ends at 9 AM.  Patients are seen on a first-come first-served basis, and a limited number are seen during each clinic.   [facility]  [address], [place], [place] [phone]   Eligibility Requirements You must have lived in [place], [place], or [place] counties for at least the last three months.   You cannot be eligible for state or federal sponsored [employer] [place]healthcare insurance, including [employer], [place]icaid, or [employer]icare.   You generally cannot be eligible for healthcare insurance through your employer.    How to apply: Eligibility screenings are held every Tuesday and Wednesday afternoon from 1:00 pm until 4:00 pm. You do not need an appointment for the interview!  [facility] [address], [place], [place] [phone]   [facility]  [phone]   [facility]  [phone]   [facility]  [phone]       Dental Pain Toothache is pain in or around a tooth. It may get worse with chewing or with cold or heat.  HOME CARE  Your dentist may use a numbing medicine during treatment. If so, you may need to avoid eating until the medicine wears off. Ask your dentist about this.  Only take medicine as told by your dentist or doctor.  Avoid chewing food near the painful tooth until after all treatment is done. Ask your dentist about this. GET HELP RIGHT AWAY IF:   The problem gets worse or new problems appear.  You have a fever.  There is redness and puffiness (swelling) of the face, jaw, or neck.  You cannot open your mouth.  There is pain in the jaw.  There is very bad pain that is not helped by medicine. MAKE SURE YOU:   Understand these instructions.  Will watch your condition.  Will get help right away if you are not doing well or get worse. Document Released: 03/23/2008 Document Revised: 12/28/2011 Document Reviewed: 03/23/2008 [facility] Patient Information 2015 [place], [place]. This information is not intended to replace advice given to you by your health care provider. Make sure you discuss any questions you have with your health care provider.  Dental Caries Dental caries is tooth decay. This decay can cause a hole in teeth (cavity) that can get bigger and deeper over time. HOME CARE  Brush and floss your teeth. Do this at least two times a day.  Use a fluoride toothpaste.  Use a mouth rinse if told by your dentist or doctor.  Eat less sugary and starchy foods. Drink less sugary drinks.  Avoid snacking often on sugary and starchy foods. Avoid sipping often on sugary drinks.  Keep regular checkups and cleanings with your dentist.  Use fluoride supplements if told by your  dentist or doctor.  Allow fluoride to be applied to teeth if told by your dentist or doctor. Document Released: 07/14/2008 Document Revised: 02/19/2014 Document Reviewed: 10/07/2012 [facility] Patient Information 2015 [place], [place]. This information is not intended to replace advice given to you by your health care provider. Make sure you discuss any questions you have with your health care provider.

## 2014-09-10 NOTE — ED Notes (Signed)
Pt presents with right front dental pain onset 2 days, denies medications at home.  Denies fever or chills.

## 2015-01-22 IMAGING — CT CT ANGIO CHEST
2 of 7 series · 18 of 36 positions shown · IV contrast (CONTRAST)
Comparison: Plain film lobes of the chest 01/02/2014. CT abdomen
and pelvis 12/25/2013.

CLINICAL DATA: Shortness of breath.

EXAM:
CT ANGIOGRAPHY CHEST WITH CONTRAST
TECHNIQUE: Multidetector CT imaging of the chest was performed using the
standard protocol during bolus administration of intravenous
contrast. Multiplanar CT image reconstructions and MIPs were
obtained to evaluate the vascular anatomy.
CONTRAST:  90 mL OMNIPAQUE IOHEXOL 350 MG/ML SOLN

[Series 5: pe thins · axial · 0.72mm/px · z∈[+53,+362]mm · 17 of 349 slices shown]
[im 20/349  lung]
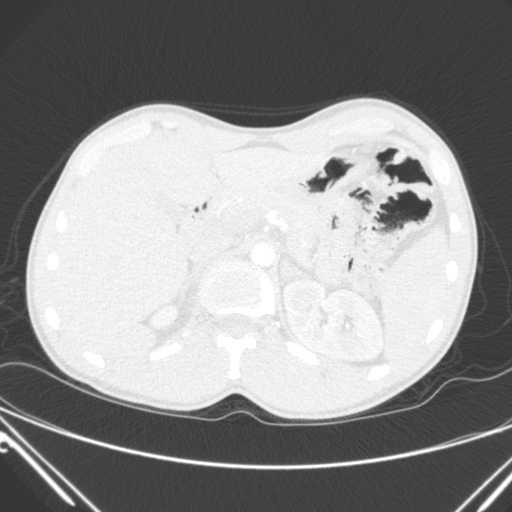
[im 39/349  mediastinal]
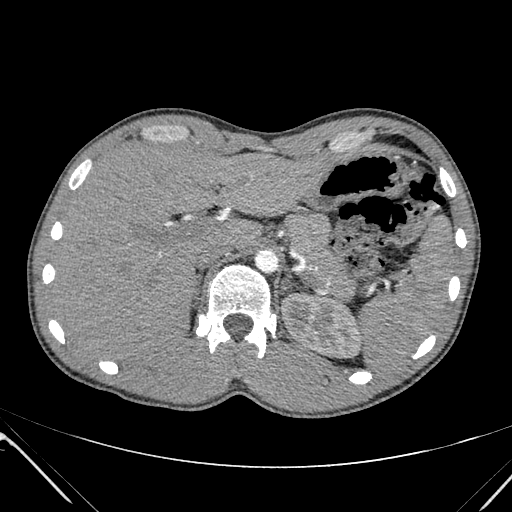
[im 59/349  lung]
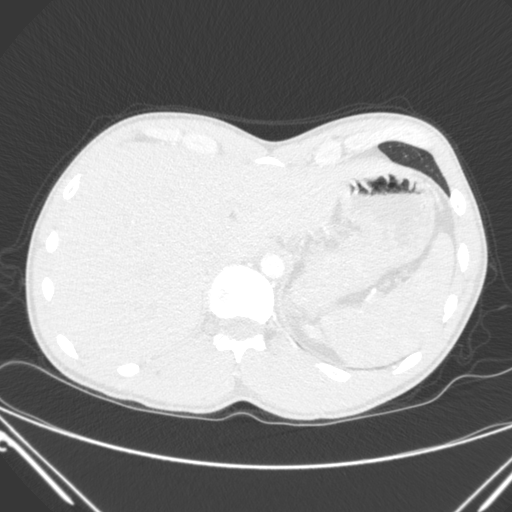
[im 78/349  mediastinal]
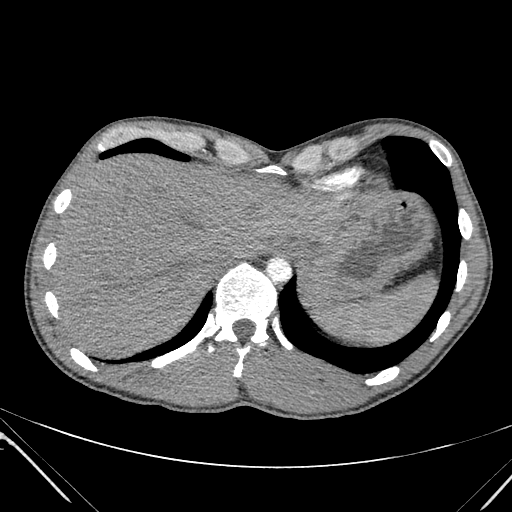
[im 97/349  lung]
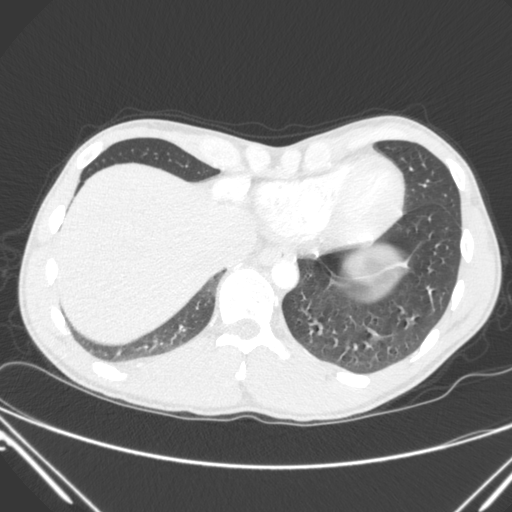
[im 117/349  mediastinal]
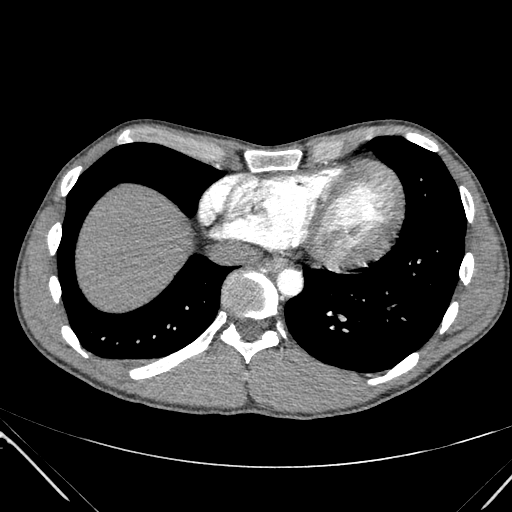
[im 136/349  lung]
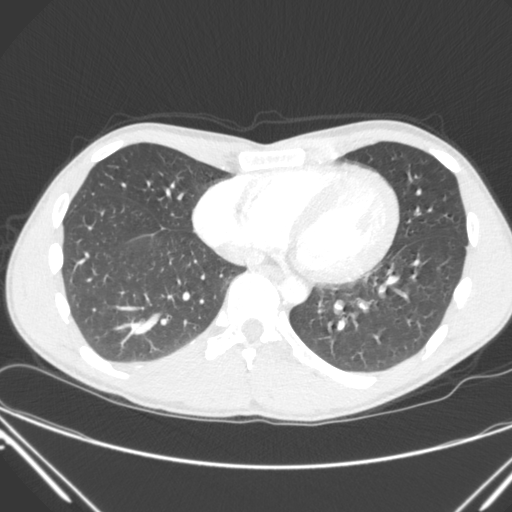
[im 155/349  mediastinal]
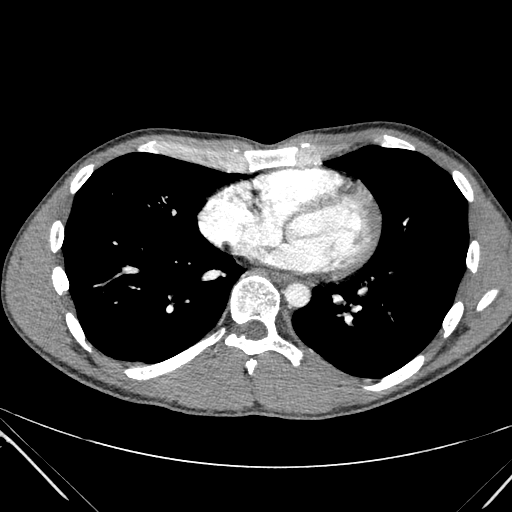
[im 175/349  lung]
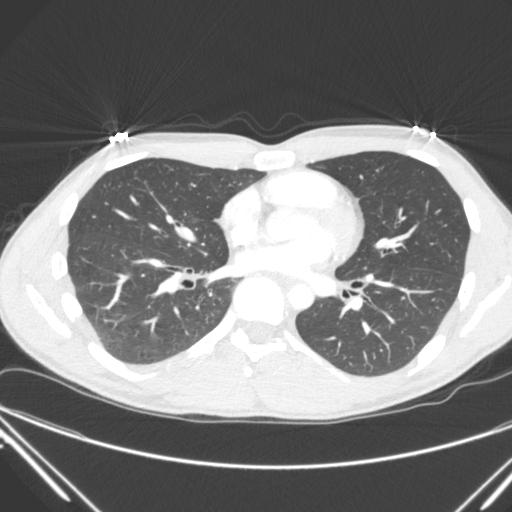
[im 194/349  mediastinal]
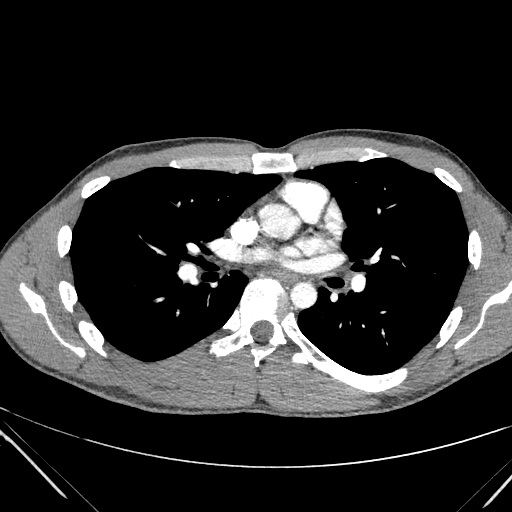
[im 213/349  lung]
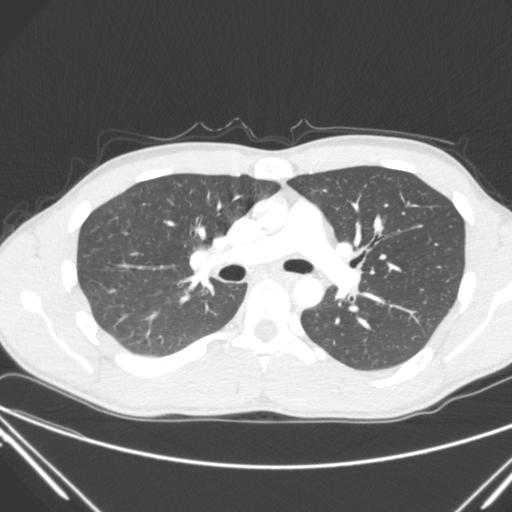
[im 233/349  mediastinal]
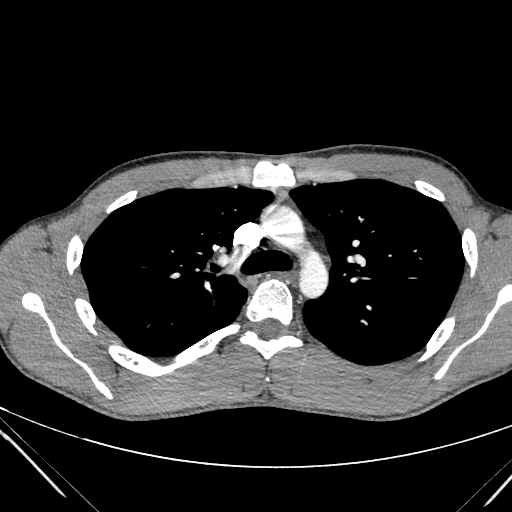
[im 252/349  lung]
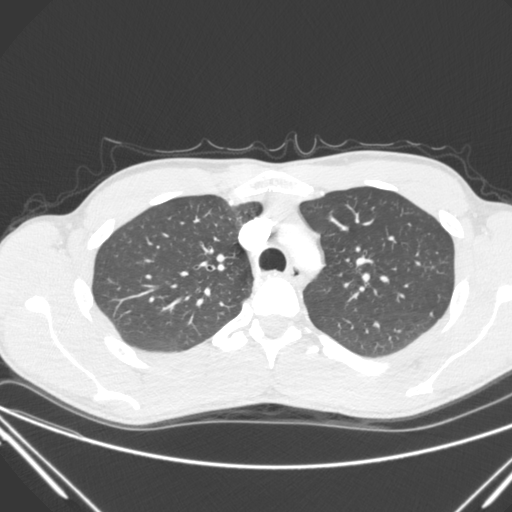
[im 271/349  mediastinal]
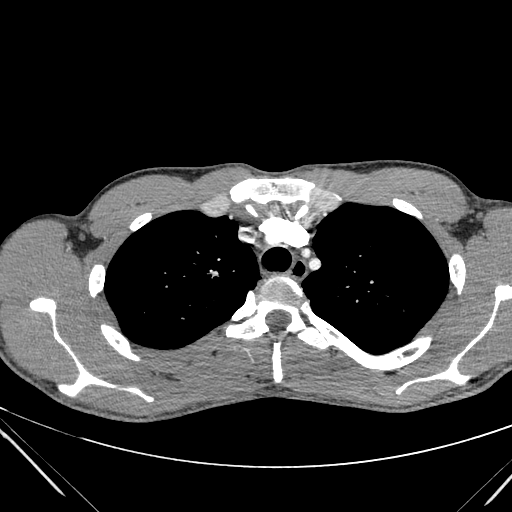
[im 291/349  lung]
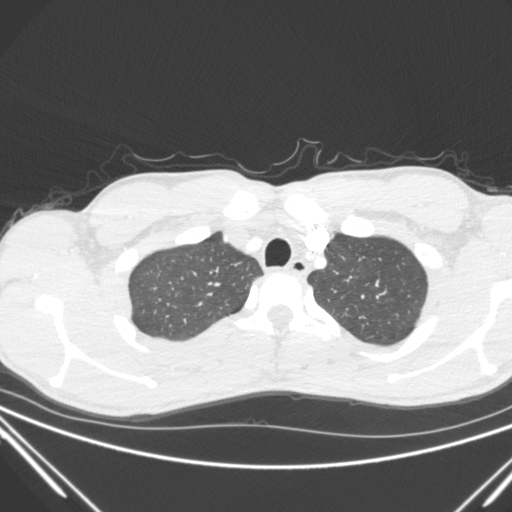
[im 310/349  mediastinal]
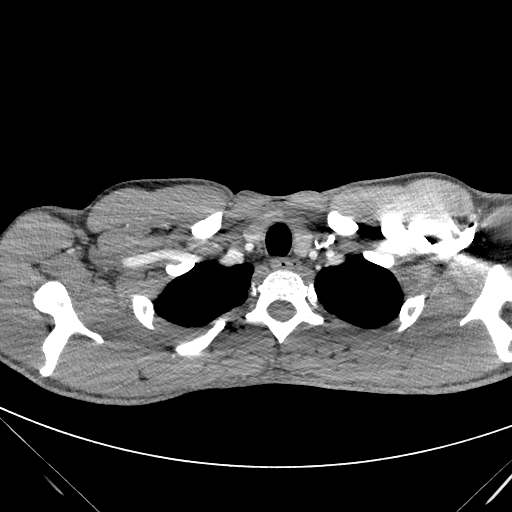
[im 329/349  lung]
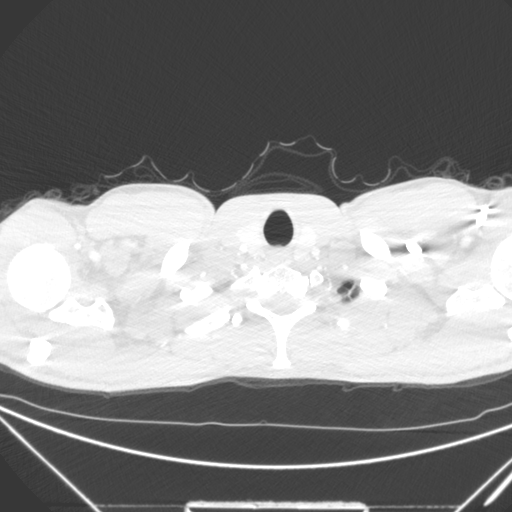

[coronals · coronal · 0.72mm/px · 1 of 105 slices shown]
[im 53/105  mediastinal]
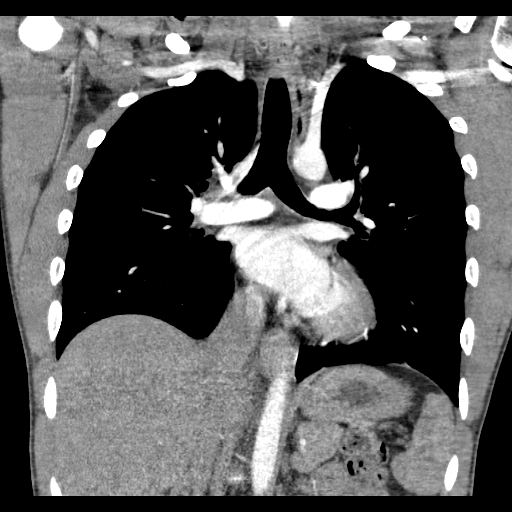

[18 of 36 positions shown; findings below may reference images not displayed]

FINDINGS: There is no pulmonary embolus. No axillary, hilar or mediastinal
lymphadenopathy. Heart size is normal. Pectus deformity is noted.
Trace right pleural effusion is seen. No left pleural or pericardial
effusion. The lungs demonstrate some early scratch the lungs
demonstrate paraseptal emphysematous change in the apices. The lungs
are otherwise clear. Visualized upper abdomen is unremarkable. There
are acute appearing nondisplaced fractures of the posterior arch of
the right ninth and tenth ribs.

Review of the MIP images confirms the above findings.
IMPRESSION: Negative for pulmonary embolus.

Acute, nondisplaced fractures of the posterior right ninth and tenth
ribs associated trace right pleural effusion.

Paraseptal emphysema in the apices.

## 2015-05-18 ENCOUNTER — Emergency Department (HOSPITAL_COMMUNITY)
Admission: EM | Admit: 2015-05-18 | Discharge: 2015-05-18 | Disposition: A | Discharge status: Home / Self Care | Payer: No Typology Code available for payment source | Attending: Emergency Medicine | Admitting: Emergency Medicine

## 2015-05-18 ENCOUNTER — Encounter (HOSPITAL_COMMUNITY): Payer: Self-pay | Admitting: Emergency Medicine

## 2015-05-18 DIAGNOSIS — Z720 Tobacco use: Secondary | ICD-10-CM | POA: Insufficient documentation

## 2015-05-18 DIAGNOSIS — X58XXXA Exposure to other specified factors, initial encounter: Secondary | ICD-10-CM | POA: Insufficient documentation

## 2015-05-18 DIAGNOSIS — T40601A Poisoning by unspecified narcotics, accidental (unintentional), initial encounter: Secondary | ICD-10-CM | POA: Insufficient documentation

## 2015-05-18 DIAGNOSIS — Y998 Other external cause status: Secondary | ICD-10-CM | POA: Insufficient documentation

## 2015-05-18 DIAGNOSIS — Y9289 Other specified places as the place of occurrence of the external cause: Secondary | ICD-10-CM | POA: Insufficient documentation

## 2015-05-18 DIAGNOSIS — Y9389 Activity, other specified: Secondary | ICD-10-CM | POA: Insufficient documentation

## 2015-05-18 DIAGNOSIS — R0681 Apnea, not elsewhere classified: Secondary | ICD-10-CM

## 2015-05-18 DIAGNOSIS — Z8679 Personal history of other diseases of the circulatory system: Secondary | ICD-10-CM | POA: Insufficient documentation

## 2015-05-18 DIAGNOSIS — Z872 Personal history of diseases of the skin and subcutaneous tissue: Secondary | ICD-10-CM | POA: Insufficient documentation

## 2015-05-18 LAB — I-STAT CHEM 8, ED
BUN: 9 mg/dL (ref 6–20)
Calcium, Ion: 1.28 mmol/L — ABNORMAL HIGH (ref 1.12–1.23)
Chloride: 103 mmol/L (ref 101–111)
Creatinine, Ser: 1.1 mg/dL (ref 0.61–1.24)
Glucose, Bld: 163 mg/dL — ABNORMAL HIGH (ref 65–99)
HCT: 44 % (ref 39.0–52.0)
Hemoglobin: 15 g/dL (ref 13.0–17.0)
Potassium: 4 mmol/L (ref 3.5–5.1)
Sodium: 143 mmol/L (ref 135–145)
TCO2: 26 mmol/L (ref 0–100)

## 2015-05-18 MED ORDER — NALOXONE HCL 1 MG/ML IJ SOLN
0.8000 mg | Freq: Once | INTRAMUSCULAR | Status: AC
  Administered 2015-05-18: 0.8 mg via INTRAVENOUS

## 2015-05-18 MED ORDER — SODIUM CHLORIDE 0.9 % IV BOLUS (SEPSIS)
1000.0000 mL | Freq: Once | INTRAVENOUS | Status: AC
  Administered 2015-05-18: 1000 mL via INTRAVENOUS

## 2015-05-18 NOTE — Discharge Instructions (Signed)
Narcotic Overdose A narcotic overdose is the misuse or overuse of a narcotic drug. A narcotic overdose can make you pass out and stop breathing. If you are not treated right away, this can cause permanent brain damage or stop your heart. Medicine may be given to reverse the effects of an overdose. If so, this medicine may bring on withdrawal symptoms. The symptoms may be abdominal cramps, throwing up (vomiting), sweating, chills, and nervousness. Injecting narcotics can cause more problems than just an overdose. AIDS, hepatitis, and other very serious infections are transmitted by sharing needles and syringes. If you decide to quit using, there are medicines which can help you through the withdrawal period. Trying to quit all at once on your own can be uncomfortable, but not life-threatening. Call your caregiver, Narcotics Anonymous, or any drug and alcohol treatment program for further help.  Document Released: 11/12/2004 Document Revised: 12/28/2011 Document Reviewed: 09/06/2009 [facility] Patient Information 2015 [place], [place]. This information is not intended to replace advice given to you by your health care provider. Make sure you discuss any questions you have with your health care provider.   If you were given medicines take as directed.  If you are on coumadin or contraceptives realize their levels and effectiveness is altered by many different medicines.  If you have any reaction (rash, tongues swelling, other) to the medicines stop taking and see a physician.    If your blood pressure was elevated in the ER make sure you follow up for management with a primary doctor or return for chest pain, shortness of breath or stroke symptoms.  Please follow up as directed and return to the ER or see a physician for new or worsening symptoms.  Thank you. Filed Vitals:   05/18/15 1815 05/18/15 1830 05/18/15 1835 05/18/15 1845  BP: 132/90 138/73  133/64  Pulse: 62 75 72 66  Temp:      TempSrc:        Resp: 15 10 16 13   Height:      Weight:      SpO2: 100% 100% 100% 100%    Emergency Department Resource Guide 1) Find a Doctor and Pay Out of Pocket Although you won't have to find out who is covered by your insurance plan, it is a good idea to ask around and get recommendations. You will then need to call the office and see if the doctor you have chosen will accept you as a new patient and what types of options they offer for patients who are self-pay. Some doctors offer discounts or will set up payment plans for their patients who do not have insurance, but you will need to ask so you aren't surprised when you get to your appointment.  2) Contact Your Local Health Department Not all health departments have doctors that can see patients for sick visits, but many do, so it is worth a call to see if yours does. If you don't know where your local health department is, you can check in your phone book. The CDC also has a tool to help you locate your state's health department, and many state websites also have listings of all of their local health departments.  3) Find a Walk-in Clinic If your illness is not likely to be very severe or complicated, you may want to try a walk in clinic. These are popping up all over the country in pharmacies, drugstores, and shopping centers. They're usually staffed by nurse practitioners or physician assistants that have been  trained to treat common illnesses and complaints. They're usually fairly quick and inexpensive. However, if you have serious medical issues or chronic medical problems, these are probably not your best option.  No Primary Care Doctor: - Call Health Connect at  [phone] - they can help you locate a primary care doctor that  accepts your insurance, provides certain services, etc. - Physician Referral Service- [phone]  Chronic Pain Problems: Organization         Address  Phone   Notes  [facility]  [phone]  Patients need to be referred by their primary care doctor.   Medication Assistance: Organization         Address  Phone   Notes  [facility] [address] [place] [place], [place] [address] [phone] --Must be a resident of [facility] -- Must have NO insurance coverage whatsoever (no Medicaid/ Medicare, etc.) -- The pt. MUST have a primary care doctor that directs their care regularly and follows them in the community   [facility]  [phone]   [employer]  [phone]    Agencies that provide inexpensive medical care: Organization         Address  Phone   Notes  [facility]  [phone]   [facility]    [phone]   [facility] [address] [place], [place] [address] [phone]   [facility] [address] [place] [phone]   Planned Parenthood    [phone]   [facility]    [phone]   Community Health and [facility]  [address], [place] Phone:  [phone], Fax:  [phone] Hours of Operation:  9 am - 6 pm, M-F.  Also accepts Medicaid/Medicare and self-pay.  [facility]  [address], [place] Phone: [phone], Fax: [phone]. Hours of Operation:  8:30 am - 5:30 pm, M-F.  Also accepts Medicaid and self-pay.  [facility] [place] [address] [place] Phone: [phone]   [facility] [address] Natasha Bence [place], [place] [phone], Ext. [phone] Mondays & Thursdays: 7-9 AM.  First 15 patients are seen on a first come, first serve basis.    Medicaid-accepting [facility] Providers:  Organization         Address  Phone   Notes  [facility] [address], [place] [phone] Also accepts self-pay patients.  [facility] [address] Laurell Josephs [place], [place]  [phone]   [facility] [address], [place] [phone]   [facility] [address], [place] [phone]   Renaye Rakers [address], [place]   [phone] Only accepts [place] Access [place] patients after they have their name applied to their card.   Self-Pay (no insurance) in [facility]:  Organization         Address  Phone   Notes  Sickle Cell Patients, [facility] [address] [place], [place] [phone]   [facility] [address] [place], [place] [phone]   [facility]  [address], [place] [phone]   [facility]/Dr. Osei-Bonsu  [address] [place] or [place]  [address]ure[address], [place] [phone] Phone number for both [facility] and [place] locations is the same.  Urgent Medical and [facility] [address] [place] [phone]   [facility] [address], [place] or [address] [phone]   [facility] [address] [place] [phone], phone; [phone], fax Sees patients 1st and 3rd Saturday of every month.  Must not qualify for public or private insurance (i.e. Medicaid, Medicare, [place] Health Choice, Veterans' Benefits)  Household income should be no more than 200% of the poverty level The clinic cannot treat you if you are pregnant or think you are pregnant  Sexually transmitted diseases are not treated at the clinic.    Dental Care: Organization         Address  Phone  Notes  [facility] Department of [facility] [address] [place], [place] [phone] Accepts children up to age 1 who are enrolled in [place] or [place] Health Choice; pregnant women with a Medicaid card; and children who have applied for Medicaid or [place] Health Choice, but were  declined, whose parents can pay a reduced fee at time of service.  [facility] Department of [facility]  [address], [place] [phone] Accepts children up to age 45 who are enrolled in [place] or [place] Health Choice; pregnant women with a Medicaid card; and children who have applied for Medicaid or  Health Choice, but were declined, whose parents can pay a reduced fee at time of service.  [facility]  [address] [place], [place] [phone] Patients are seen by appointment only. Walk-ins are not accepted. [facility] will see patients 42 years of age and older. Monday - Tuesday (8am-5pm) Most Wednesdays (8:30-5pm) $30 per visit, cash only  [facility] Adult [facility] Access [facility]  [address], [facility] [phone] Patients are seen by appointment only. Walk-ins are not accepted. [facility] will see patients 70 years of age and older. One Wednesday Evening (Monthly: Volunteer Based).  $30 per visit, cash only  [employer]  [phone] for adults; Children under age 74, call Graduate Pediatric Dentistry at [phone]. Children aged 68-14, please call [phone] to request a pediatric application.  Dental services are provided in all areas of dental care including fillings, crowns and bridges, complete and partial dentures, implants, gum treatment, root canals, and extractions. Preventive care is also provided. Treatment is provided to both adults and children. Patients are selected via a lottery and there is often a waiting list.   [facility] [address], [place]  [phone] [email or web address]   [facility] [address] [place], [place] [phone], Ext. [phone] Second and Fourth Thursday of each month, opens at 6:30 AM; Clinic ends at 9 AM.  Patients are seen on a first-come first-served basis, and a limited number are seen during each clinic.    [facility]  [address] Ether Griffins [place], [place] [phone]   Eligibility Requirements You must have lived in [place], [place], or [place] counties for at least the last three months.   You cannot be eligible for state or federal sponsored [place], including [employer], [place], or [employer].   You generally cannot be eligible for healthcare insurance through your employer.    How to apply: Eligibility screenings are  held every Tuesday and Wednesday afternoon from 1:00 pm until 4:00 pm. You do not need an appointment for the interview!  [facility] [address], [place], [place] [phone]   [facility] Health Department  [phone]   [facility]  [phone]   [facility]  [phone]    Behavioral Health Resources in the Community: Intensive Outpatient Programs Organization         Address  Phone  Notes  [facility] [address] [place], [place] [phone]   [facility] Outpatient [address], [place], [place] [phone]   [facility] [address] [place], [place]  [phone]   [facility] Mental Health [address],  [place], [place] [phone] or [phone]   Substance Abuse Resources Organization         Address  Phone  Notes  [facility]  [phone]   [facility]  [phone]   [facility]  [phone]   Floydene Flock  [phone]   Residential & Outpatient Substance Abuse Program  [phone]   Psychological Services Organization         Address  Phone  Notes  [facility] Behavioral Health  [phone]   [facility] Services  [phone]   [facility] Mental Health [address], [place] [phone] or [phone]    Mobile Crisis Teams Organization         Address  Phone  Notes  [facility], Mobile Crisis Care  Unit  [phone]   [facility]  [address] [place], [place] [phone]   Doristine Locks [address] [place] [phone]    Self-Help/Support Groups Organization         Address  Phone             Notes  [facility] [place] - variety of support groups  [phone]- [phone] Call for more information  Narcotics Anonymous (NA), [facility] [address], [employer] [place]  2 meetings at this location   Statistician         Address  Phone  Notes  [facility] [address],    [place]  [phone]   [facility]  [address], [place] [address], [place], [place] [phone]   [facility] [address] [place], [place] [phone] Admissions: 8am-3pm M-F  [facility] [address]    [place], [place] [phone]   [facility] [address] [place], [place], [place] [phone]   [facility] [address],  [place], [place] [phone]   [facility] - Intensive Outpatient [address] Laurell Josephs [address], [place], [place] [phone]   [facility] ([facility] [address] [place].,  [place], [place] [phone] or [phone]   [facility] ([facility]) [address], [place], [place] [phone] Accepts Medicaid  [facility] [address],  [place] [phone] Substance Abuse/Addiction Treatment   [facility] Organization         Address  Phone  Notes  [facility]  [phone]   Angie Fava, PhD [address] Ervin Knack [place], [place]   [phone] or [phone]   [facility]al   [address] [place], [place] [phone]   [facility] [address], [place], [place] [phone] Insurance/Medicaid/sponsorship through  [facility]  [facility] [address]                                     [place], [place] [phone] Therapy/tele-psych/case  [facility] [address]   [place], [place] [phone]    Dr. Lolly Mustache  [phone]   [facility]  [employer] [facility] Dept. 1) [address], [place] 2) [address], [place] 3)  [address], [place] [phone]  [phone]   [facility] Child Abuse Hotline [phone] or [phone] (After Hours)

## 2015-05-18 NOTE — ED Notes (Signed)
Pt reports feeling his eyes becoming heavy. Pt denies shortness of breath. Pt talking in complete sentences. Dr Jodi Mourning made aware.

## 2015-05-18 NOTE — ED Provider Notes (Signed)
History   Chief Complaint  Patient presents with  . Drug Overdose    HPI Patient is a 29 year old male who arrives after being dropped off at the front of the ED unresponsive and with agonal breathing. History is limited secondary to patient's condition. Upon being dropped from the ED bystanders began resuscitation and bag-valve-mask assisted ventilation.  Past medical/surgical history, social history, medications, allergies and FH have been reviewed with patient and/or in documentation. Furthermore, if pt family or friend(s) present, additional historical information was obtained from them.  Past Medical History  Diagnosis Date  . Varicose veins   . Leg pain   . Ulcer    No past surgical history on file. Family History  Problem Relation Age of Onset  . Depression Mother   . Diabetes Mother   . Hypertension Mother   . Heart disease Father     before age 75   History  Substance Use Topics  . Smoking status: Current Every Day Smoker -- 0.50 packs/day for 10 years    Types: Cigarettes  . Smokeless tobacco: Not on file  . Alcohol Use: 1.2 oz/week    2 Cans of beer per week     Comment: very rarely     Review of Systems Unable to obtain secondary to patient condition  Physical Exam  Physical Exam ED Triage Vitals  Enc Vitals Group     BP 05/18/15 1548 138/91 mmHg     Pulse Rate 05/18/15 1540 154     Resp 05/18/15 1537 29     Temp 05/18/15 1551 99.6 F (37.6 C)     Temp Source 05/18/15 1551 Oral     SpO2 05/18/15 1540 100 %     Weight 05/18/15 1547 178 lb (80.74 kg)     Height 05/18/15 1547 6\' 3"  (1.905 m)     Head Cir --      Peak Flow --      Pain Score 05/18/15 1547 0     Pain Loc --      Pain Edu? --      Excl. in GC? --     Constitutional: unresponsive 29 yo male. BVM ongoing. Dusky appearance. Head: Normocephalic and atraumatic.  Eyes: Extraocular motion intact, no scleral icterus. Pinpoint pupils. Mouth: MMM, OP clear Neck: Supple without  meningismus, mass, or overt JVD Respiratory: Agonal breathing. Rhonchi BL with BVM CV: RRR, no obvious murmurs.  Pulses +2 and symmetric. Euvolemic Torso: atraumatic. Abdomen: Soft, NT, ND, no r/g. No mass.  MSK: Extremities are atraumatic without deformity, ROM intact Skin: diaphoretic. Neuro: GCS 3. Slight movement of arms.   ED Course  INTUBATION Date/Time: 05/18/2015 7:18 PM Performed by: Ames Dura Authorized by: Ames Dura Consent: The procedure was performed in an emergent situation. Indications: airway protection Intubation method: direct Patient status: unconscious Preoxygenation: BVM Laryngoscope size: Mac 4 Tube size: 7.5 mm Tube type: cuffed Number of attempts: 1 Cricoid pressure: yes Cords visualized: yes Post-procedure assessment: chest rise and ETCO2 monitor Breath sounds: equal Cuff inflated: yes ETT to lip: 23 cm Patient tolerance: Patient tolerated the procedure well with no immediate complications Comments: After placing tube pt awoke and began breathing spontaneously and ETT was pulled and pt had no resp distress and was GCS 15.     Labs Reviewed  I-STAT CHEM 8, ED - Abnormal; Notable for the following:    Glucose, Bld 163 (*)    Calcium, Ion 1.28 (*)    All other components within normal  limits   I personally reviewed and interpreted all labs.  No orders to display   I personally viewed above image(s) which were used in my medical decision making. Formal interpretations by Radiology.  MDM: Douglas Morgan is a 29 y.o. male with H&P as above who   presents after being dropped off the front of ED unresponsive. On arrival patient is diaphoretic, agonal breathing, with pulses. Resuscitation was begun. Pupils are pinpoint. Given patient's GCS of 3. As I prepared for intubation. IV access was obtained and Narcan was given with no initial effect. Patient was subsequently intubated as above without complication and patient immmediately became  conscious and ET tube was pulled out. Patient is now alert and oriented 4. Patient will receive supportive care and will be observed in the ED until stable for discharge.    POCT glucose and lytes ordered. Checking screening EKG. IVF.   -Results:  W/u unremarkable -Re-evaluation: pt remained HDS, NAD in ED. Lengthy discussion with pt regarding his dangerous drug abuse. Pt says he will never use heroin again.  Stable for d/c. Old records reviewed (if available). Labs and imaging reviewed personally by myself and considered in medical decision making if ordered. Clinical Impression: 1. Narcotic overdose, accidental or unintentional, initial encounter   2. Apnea     Disposition: Discharge  Condition: Good  I have discussed the results, Dx and Tx plan with the pt(& family if present). He/she/they expressed understanding and agree(s) with the plan. Discharge instructions discussed at great length. Strict return precautions discussed and pt &/or family have verbalized understanding of the instructions. No further questions at time of discharge.    New Prescriptions   No medications on file    Follow Up: [facility] [address] [place] [address] [phone] Call in 3 days    Pt seen in conjunction with Dr. Blane Ohara, MD  Ames Dura, DO [facility] Emergency Medicine Resident - PGY-3     Ames Dura, MD 05/19/15 0151  Blane Ohara, MD 05/21/15 [phone]

## 2015-05-18 NOTE — ED Notes (Signed)
Received pt via Private vehicle with c/o heroin use. Pt was unresponsive in car. Pt given 0.8 MG of Narcan at 1537 and immediately became responsive. Pt admits to having a shot of Heroin.

## 2015-07-23 ENCOUNTER — Emergency Department (HOSPITAL_COMMUNITY): Payer: No Typology Code available for payment source

## 2015-07-23 ENCOUNTER — Emergency Department (HOSPITAL_COMMUNITY)
Admission: EM | Admit: 2015-07-23 | Discharge: 2015-07-23 | Disposition: A | Discharge status: Home / Self Care | Payer: No Typology Code available for payment source | Attending: Emergency Medicine | Admitting: Emergency Medicine

## 2015-07-23 ENCOUNTER — Encounter (HOSPITAL_COMMUNITY): Payer: Self-pay | Admitting: *Deleted

## 2015-07-23 DIAGNOSIS — Y9289 Other specified places as the place of occurrence of the external cause: Secondary | ICD-10-CM | POA: Insufficient documentation

## 2015-07-23 DIAGNOSIS — Z720 Tobacco use: Secondary | ICD-10-CM | POA: Insufficient documentation

## 2015-07-23 DIAGNOSIS — S92911A Unspecified fracture of right toe(s), initial encounter for closed fracture: Secondary | ICD-10-CM

## 2015-07-23 DIAGNOSIS — Y998 Other external cause status: Secondary | ICD-10-CM | POA: Insufficient documentation

## 2015-07-23 DIAGNOSIS — X58XXXA Exposure to other specified factors, initial encounter: Secondary | ICD-10-CM | POA: Insufficient documentation

## 2015-07-23 DIAGNOSIS — Y9389 Activity, other specified: Secondary | ICD-10-CM | POA: Insufficient documentation

## 2015-07-23 DIAGNOSIS — S92514A Nondisplaced fracture of proximal phalanx of right lesser toe(s), initial encounter for closed fracture: Secondary | ICD-10-CM | POA: Insufficient documentation

## 2015-07-23 MED ORDER — TRAMADOL HCL 50 MG PO TABS: 50.0000 mg | ORAL_TABLET | Freq: Four times a day (QID) | ORAL | Status: DC | PRN

## 2015-07-23 NOTE — ED Notes (Signed)
Pt at xray.  Ice placed at bedside for pt's return.

## 2015-07-23 NOTE — ED Notes (Signed)
Ortho paged. 

## 2015-07-23 NOTE — ED Notes (Signed)
Pt able to ambulate (with limp) in room independently.

## 2015-07-23 NOTE — ED Provider Notes (Signed)
CSN: [id number]     Arrival date & time 07/23/15  1620 History  By signing my name below, I, Murriel Hopper, attest that this documentation has been prepared under the direction and in the presence of Burna Forts, PA-C  Electronically Signed: Murriel Hopper, ED Scribe. 07/23/2015. 9:45 PM.   Chief Complaint  Patient presents with  . Foot Injury      The history is provided by the patient. No language interpreter was used.   HPI Comments: Douglas Morgan is a 29 y.o. male who presents to the Emergency Department complaining of constant, worsening lateral left foot pain that has been present since 09/17 after pt stubbed his toe on a sidewalk while he was on vacation. Pt states he stubbed his pinky toe, and received x-rays for it, but was told his foot was so swollen that radiology was unable to tell if it was fractured. Pt states he cannot put weight on his foot to ambulate unless he walks with a boot on his foot. Pt states that even when he wears a boot his foot still swells, and has pain when he walks. Pt states he sleeps with his foot elevated and an ice pack on his foot, and states he has been inactive for a week with this current pain. Pt states he has been using Ibuprofen and Goody's powders to treat his pain with little relief.    Past Medical History  Diagnosis Date  . Varicose veins   . Leg pain   . Ulcer    History reviewed. No pertinent past surgical history. Family History  Problem Relation Age of Onset  . Depression Mother   . Diabetes Mother   . Hypertension Mother   . Heart disease Father     before age 68   Social History  Substance Use Topics  . Smoking status: Current Every Day Smoker -- 0.50 packs/day for 10 years    Types: Cigarettes  . Smokeless tobacco: None  . Alcohol Use: 1.2 oz/week    2 Cans of beer per week     Comment: very rarely    Review of Systems  All other systems reviewed and are negative.     Allergies  Review of patient's allergies  indicates no known allergies.  Home Medications   Prior to Admission medications   Medication Sig Start Date End Date Taking? Authorizing Provider  traMADol (ULTRAM) 50 MG tablet Take 1 tablet (50 mg total) by mouth every 6 (six) hours as needed. 07/23/15   Maxcine Strong, PA-C   BP 136/83 mmHg  Pulse 87  Temp(Src) 98 F (36.7 C) (Oral)  Resp 20  SpO2 100% Physical Exam  Constitutional: He is oriented to person, place, and time. He appears well-developed and well-nourished.  HENT:  Head: Normocephalic and atraumatic.  Cardiovascular: Normal rate.   Pulmonary/Chest: Effort normal.  Abdominal: He exhibits no distension.  Musculoskeletal:  Obvious swelling to dorsum of left foot Tender to palpation Redness and bruising to skin  Warm to touch Sensation intact Cap refill <3 seconds Ankle full active ROM Tender to palpation   Neurological: He is alert and oriented to person, place, and time.  Skin: Skin is warm and dry.  Psychiatric: He has a normal mood and affect.  Nursing note and vitals reviewed.   ED Course  Procedures (including critical care time) DIAGNOSTIC STUDIES: Oxygen Saturation is 100% on room air, normal by my interpretation.    COORDINATION OF CARE: 5:20 PM Discussed treatment plan with  pt at bedside and pt agreed to plan.   Labs Review Labs Reviewed - No data to display  Imaging Review Dg Foot Complete Right  07/23/2015   CLINICAL DATA:  Anterior and lateral right foot pain and fourth toe pain since kicking a concrete slab 2 weeks ago. Bruising.  EXAM: RIGHT FOOT COMPLETE - 3+ VIEW  COMPARISON:  None.  FINDINGS: There is a nondisplaced spiral fracture of the shaft of the proximal phalanx of the fourth toe. The other bones of foot are normal.  IMPRESSION: Fracture of the proximal phalanx of the fourth toe.   Electronically Signed   By: Francene Boyers M.D.   On: 07/23/2015 17:25   I have personally reviewed and evaluated these images and lab results as part  of my medical decision-making.   EKG Interpretation None      MDM   Final diagnoses:  Toe fracture, right, closed, initial encounter  Labs:  Imaging:  Consults:  Therapeutics:  Discharge Meds: Tramadol  Assessment/Plan: Patient presents with a fractured the proximal phalanx of the fourth toe. Patient buddy taped, encouraged continue using walking boot, given a prescription for above medications encouraged follow-up with orthopedist for reevaluation. Patient verbalized understanding and agreement for today's plan and had no further questions concerns       I personally performed the services described in this documentation, which was scribed in my presence. The recorded information has been reviewed and is accurate.     Eyvonne Mechanic, PA-C 07/24/15 [id number]  Margarita Grizzle, MD 07/25/15 [phone]

## 2015-07-23 NOTE — Discharge Instructions (Signed)
Buddy Taping of Toes We have taped your toes together to keep them from moving. This is called "buddy taping" since we used a part of your own body to keep the injured part still. We placed soft padding between your toes to keep them from rubbing against each other. Buddy taping will help with healing and to reduce pain. Keep your toes buddy taped together for as long as directed by your caregiver. HOME CARE INSTRUCTIONS   Raise your injured area above the level of your heart while sitting or lying down. Prop it up with pillows.  An ice pack used every twenty minutes, while awake, for the first one to two days may be helpful. Put ice in a plastic bag and put a towel between the bag and your skin.  Watch for signs that the taping is too tight. These signs may be:  Numbness of your taped toes.  Coolness of your taped toes.  Color change in the area beyond the tape.  Increased pain.  If you have any of these signs, loosen or rewrap the tape. If you need to loosen or rewrap the buddy tape, make sure you use the padding again. SEEK IMMEDIATE MEDICAL CARE IF:   You have worse pain, swelling, inflammation (soreness), drainage or bleeding after you rewrap the tape.  Any new problems occur. MAKE SURE YOU:   Understand these instructions.  Will watch your condition.  Will get help right away if you are not doing well or get worse. Document Released: 07/09/2004 Document Revised: 12/28/2011 Document Reviewed: 10/02/2008 [facility] Patient Information 2015 [place], [place]. This information is not intended to replace advice given to you by your health care provider. Make sure you discuss any questions you have with your health care provider.  Please follow-up with orthopedic specialist for further evaluation and management of your fracture. Please use medication only as directed, do not drink drive or operate machinery while taking.

## 2015-07-23 NOTE — ED Notes (Signed)
Pt refused additional vitals.  Pt states "Ultram ain't worth a damn.  It ain't gonna do nothing for me".  This RN encouraged patient to give it a try at home, and use as needed.  Pt states "I've already tried it and it ain't going to work.  Roll me out of here" (to patient's friend, who pushed him out to the lobby in a wheelchair.

## 2015-07-23 NOTE — ED Notes (Signed)
Pt in c/o injury to right foot last week, states at the time he went to a hospital and had an xray but they told him due to swelling they could not tell if it was fractured, swelling and pain have continued

## 2015-07-23 NOTE — ED Notes (Signed)
Ortho at bedside applying buddy tape

## 2015-12-31 ENCOUNTER — Encounter (HOSPITAL_COMMUNITY): Payer: Self-pay | Admitting: *Deleted

## 2015-12-31 ENCOUNTER — Emergency Department (HOSPITAL_COMMUNITY)
Admission: EM | Admit: 2015-12-31 | Discharge: 2016-01-01 | Disposition: A | Discharge status: Home / Self Care | Payer: No Typology Code available for payment source | Attending: Emergency Medicine | Admitting: Emergency Medicine

## 2015-12-31 DIAGNOSIS — L03113 Cellulitis of right upper limb: Secondary | ICD-10-CM | POA: Insufficient documentation

## 2015-12-31 DIAGNOSIS — L0291 Cutaneous abscess, unspecified: Secondary | ICD-10-CM

## 2015-12-31 DIAGNOSIS — L02413 Cutaneous abscess of right upper limb: Secondary | ICD-10-CM | POA: Insufficient documentation

## 2015-12-31 DIAGNOSIS — F17210 Nicotine dependence, cigarettes, uncomplicated: Secondary | ICD-10-CM | POA: Insufficient documentation

## 2015-12-31 DIAGNOSIS — L0390 Cellulitis, unspecified: Secondary | ICD-10-CM

## 2015-12-31 DIAGNOSIS — F1110 Opioid abuse, uncomplicated: Secondary | ICD-10-CM | POA: Insufficient documentation

## 2015-12-31 DIAGNOSIS — F1910 Other psychoactive substance abuse, uncomplicated: Secondary | ICD-10-CM

## 2015-12-31 LAB — CBC WITH DIFFERENTIAL/PLATELET
Basophils Absolute: 0 10*3/uL (ref 0.0–0.1)
Basophils Relative: 0 %
Eosinophils Absolute: 0.1 10*3/uL (ref 0.0–0.7)
Eosinophils Relative: 1 %
HCT: 41.6 % (ref 39.0–52.0)
Hemoglobin: 13.2 g/dL (ref 13.0–17.0)
Lymphocytes Relative: 28 %
Lymphs Abs: 2.6 10*3/uL (ref 0.7–4.0)
MCH: 28.9 pg (ref 26.0–34.0)
MCHC: 31.7 g/dL (ref 30.0–36.0)
MCV: 91.2 fL (ref 78.0–100.0)
Monocytes Absolute: 0.7 10*3/uL (ref 0.1–1.0)
Monocytes Relative: 7 %
Neutro Abs: 5.9 10*3/uL (ref 1.7–7.7)
Neutrophils Relative %: 64 %
Platelets: 310 10*3/uL (ref 150–400)
RBC: 4.56 MIL/uL (ref 4.22–5.81)
RDW: 12.9 % (ref 11.5–15.5)
WBC: 9.2 10*3/uL (ref 4.0–10.5)

## 2015-12-31 LAB — COMPREHENSIVE METABOLIC PANEL
ALT: 16 U/L — ABNORMAL LOW (ref 17–63)
AST: 20 U/L (ref 15–41)
Albumin: 3.5 g/dL (ref 3.5–5.0)
Alkaline Phosphatase: 71 U/L (ref 38–126)
Anion gap: 8 (ref 5–15)
BUN: <5 mg/dL — ABNORMAL LOW (ref 6–20)
CO2: 26 mmol/L (ref 22–32)
Calcium: 9.1 mg/dL (ref 8.9–10.3)
Chloride: 104 mmol/L (ref 101–111)
Creatinine, Ser: 0.94 mg/dL (ref 0.61–1.24)
GFR calc Af Amer: >60 mL/min (ref >60)
GFR calc non Af Amer: >60 mL/min (ref >60)
Glucose, Bld: 111 mg/dL — ABNORMAL HIGH (ref 65–99)
Potassium: 4.6 mmol/L (ref 3.5–5.1)
Sodium: 138 mmol/L (ref 135–145)
Total Bilirubin: 0.5 mg/dL (ref 0.3–1.2)
Total Protein: 8.1 g/dL (ref 6.5–8.1)

## 2015-12-31 NOTE — ED Notes (Signed)
Pt in c/o abscess to his right forearm, pt with large abscess noted and redness around site, pt unsure of fever, no distress noted

## 2016-01-01 LAB — HIV ANTIBODY (ROUTINE TESTING W REFLEX): HIV Screen 4th Generation wRfx: NONREACTIVE

## 2016-01-01 LAB — I-STAT CG4 LACTIC ACID, ED
Lactic Acid, Venous: 2.86 mmol/L (ref 0.5–2.0)
Lactic Acid, Venous: <0.3 mmol/L — ABNORMAL LOW (ref 0.5–2.0)

## 2016-01-01 MED ORDER — KETOROLAC TROMETHAMINE 30 MG/ML IJ SOLN
30.0000 mg | Freq: Once | INTRAMUSCULAR | Status: AC
  Administered 2016-01-01: 30 mg via INTRAVENOUS

## 2016-01-01 MED ORDER — LIDOCAINE-EPINEPHRINE (PF) 2 %-1:200000 IJ SOLN
20.0000 mL | Freq: Once | INTRAMUSCULAR | Status: AC
  Administered 2016-01-01: 20 mL via INTRADERMAL

## 2016-01-01 MED ORDER — CEPHALEXIN 500 MG PO CAPS: 500.0000 mg | ORAL_CAPSULE | Freq: Four times a day (QID) | ORAL | Status: DC

## 2016-01-01 MED ORDER — KETOROLAC TROMETHAMINE 30 MG/ML IJ SOLN
INTRAMUSCULAR | Status: AC
  Filled 2016-01-01: qty 1

## 2016-01-01 MED ORDER — SULFAMETHOXAZOLE-TRIMETHOPRIM 800-160 MG PO TABS: 1.0000 | ORAL_TABLET | Freq: Two times a day (BID) | ORAL | Status: AC

## 2016-01-01 MED ORDER — VANCOMYCIN HCL IN DEXTROSE 1-5 GM/200ML-% IV SOLN
1000.0000 mg | Freq: Once | INTRAVENOUS | Status: AC
  Administered 2016-01-01: 1000 mg via INTRAVENOUS

## 2016-01-01 MED ORDER — SODIUM CHLORIDE 0.9 % IV BOLUS (SEPSIS)
1000.0000 mL | Freq: Once | INTRAVENOUS | Status: AC
  Administered 2016-01-01: 1000 mL via INTRAVENOUS

## 2016-01-01 MED ORDER — DEXTROSE 5 % IV SOLN
1.0000 g | Freq: Once | INTRAVENOUS | Status: AC
  Administered 2016-01-01: 1 g via INTRAVENOUS

## 2016-01-01 MED ORDER — LIDOCAINE-PRILOCAINE 2.5-2.5 % EX CREA
TOPICAL_CREAM | Freq: Once | CUTANEOUS | Status: AC
  Administered 2016-01-01: 05:00:00 via TOPICAL
  Filled 2016-01-01: qty 5

## 2016-01-01 MED ORDER — DEXTROSE 5 % IV SOLN
INTRAVENOUS | Status: AC
  Filled 2016-01-01: qty 10

## 2016-01-01 MED ORDER — LIDOCAINE HCL (PF) 2 % IJ SOLN
INTRAMUSCULAR | Status: AC
  Filled 2016-01-01: qty 10

## 2016-01-01 MED ORDER — IBUPROFEN 800 MG PO TABS: 800.0000 mg | ORAL_TABLET | Freq: Three times a day (TID) | ORAL | Status: DC | PRN

## 2016-01-01 NOTE — Discharge Instructions (Signed)
Please pull the packing out in 3 days. You may change the dressing daily and apply over-the-counter Neosporin to the wound. You may soak the wound in warm water and apply warm compresses to help this wound continued to drain. We are discharging you with 2 different antibiotics. Please take these until they are completely gone. I recommend you follow-up with your primary care physician, local urgent care or return to this emergency department in 3 days to have this wound rechecked. If you notice that the redness is spreading outside the lines that we have marked today, or you to have fever after being on antibiotics for 2 days, vomiting and cannot keep your medications down, please return to the hospital.  Abscess An abscess is an infected area that contains a collection of pus and debris.It can occur in almost any part of the body. An abscess is also known as a furuncle or boil. CAUSES  An abscess occurs when tissue gets infected. This can occur from blockage of oil or sweat glands, infection of hair follicles, or a minor injury to the skin. As the body tries to fight the infection, pus collects in the area and creates pressure under the skin. This pressure causes pain. People with weakened immune systems have difficulty fighting infections and get certain abscesses more often.  SYMPTOMS Usually an abscess develops on the skin and becomes a painful mass that is red, warm, and tender. If the abscess forms under the skin, you may feel a moveable soft area under the skin. Some abscesses break open (rupture) on their own, but most will continue to get worse without care. The infection can spread deeper into the body and eventually into the bloodstream, causing you to feel ill.  DIAGNOSIS  Your caregiver will take your medical history and perform a physical exam. A sample of fluid may also be taken from the abscess to determine what is causing your infection. TREATMENT  Your caregiver may prescribe antibiotic  medicines to fight the infection. However, taking antibiotics alone usually does not cure an abscess. Your caregiver may need to make a small cut (incision) in the abscess to drain the pus. In some cases, gauze is packed into the abscess to reduce pain and to continue draining the area. HOME CARE INSTRUCTIONS   Only take over-the-counter or prescription medicines for pain, discomfort, or fever as directed by your caregiver.  If you were prescribed antibiotics, take them as directed. Finish them even if you start to feel better.  If gauze is used, follow your caregiver's directions for changing the gauze.  To avoid spreading the infection:  Keep your draining abscess covered with a bandage.  Wash your hands well.  Do not share personal care items, towels, or whirlpools with others.  Avoid skin contact with others.  Keep your skin and clothes clean around the abscess.  Keep all follow-up appointments as directed by your caregiver. SEEK MEDICAL CARE IF:   You have increased pain, swelling, redness, fluid drainage, or bleeding.  You have muscle aches, chills, or a general ill feeling.  You have a fever. MAKE SURE YOU:   Understand these instructions.  Will watch your condition.  Will get help right away if you are not doing well or get worse.   This information is not intended to replace advice given to you by your health care provider. Make sure you discuss any questions you have with your health care provider.   Document Released: 07/15/2005 Document Revised: 04/05/2012 Document Reviewed:  12/18/2011 Elsevier Interactive Patient Education 2016 Elsevier Inc.  Cellulitis Cellulitis is an infection of the skin and the tissue beneath it. The infected area is usually red and tender. Cellulitis occurs most often in the arms and lower legs.  CAUSES  Cellulitis is caused by bacteria that enter the skin through cracks or cuts in the skin. The most common types of bacteria that cause  cellulitis are staphylococci and streptococci. SIGNS AND SYMPTOMS   Redness and warmth.  Swelling.  Tenderness or pain.  Fever. DIAGNOSIS  Your health care provider can usually determine what is wrong based on a physical exam. Blood tests may also be done. TREATMENT  Treatment usually involves taking an antibiotic medicine. HOME CARE INSTRUCTIONS   Take your antibiotic medicine as directed by your health care provider. Finish the antibiotic even if you start to feel better.  Keep the infected arm or leg elevated to reduce swelling.  Apply a warm cloth to the affected area up to 4 times per day to relieve pain.  Take medicines only as directed by your health care provider.  Keep all follow-up visits as directed by your health care provider. SEEK MEDICAL CARE IF:   You notice red streaks coming from the infected area.  Your red area gets larger or turns dark in color.  Your bone or joint underneath the infected area becomes painful after the skin has healed.  Your infection returns in the same area or another area.  You notice a swollen bump in the infected area.  You develop new symptoms.  You have a fever. SEEK IMMEDIATE MEDICAL CARE IF:   You feel very sleepy.  You develop vomiting or diarrhea.  You have a general ill feeling (malaise) with muscle aches and pains.   This information is not intended to replace advice given to you by your health care provider. Make sure you discuss any questions you have with your health care provider.   Document Released: 07/15/2005 Document Revised: 06/26/2015 Document Reviewed: 12/21/2011 Elsevier Interactive Patient Education [employer] Elsevier Inc.

## 2016-01-01 NOTE — ED Provider Notes (Signed)
TIME SEEN: 4:40 AM  CHIEF COMPLAINT: Right arm abscess and cellulitis  HPI: Pt is a 30 y.o. right-hand-dominant male with history of IV heroin abuse who presents emergency department with an abscess and cellulitis to his right forearm that has been present for the past 2-3 days. No fevers, chills, nausea, vomiting or diarrhea. Has never had an abscess before. He is not diabetic. Not immunocompromised.  ROS: See HPI Constitutional: no fever  Eyes: no drainage  ENT: no runny nose   Cardiovascular:  no chest pain  Resp: no SOB  GI: no vomiting GU: no dysuria Integumentary: no rash  Allergy: no hives  Musculoskeletal: no leg swelling  Neurological: no slurred speech ROS otherwise negative  PAST MEDICAL HISTORY/PAST SURGICAL HISTORY:  Past Medical History  Diagnosis Date  . Varicose veins   . Leg pain   . Ulcer     MEDICATIONS:  Prior to Admission medications   Medication Sig Start Date End Date Taking? Authorizing Provider  traMADol (ULTRAM) 50 MG tablet Take 1 tablet (50 mg total) by mouth every 6 (six) hours as needed. 07/23/15   Eyvonne Mechanic, PA-C    ALLERGIES:  No Known Allergies  SOCIAL HISTORY:  Social History  Substance Use Topics  . Smoking status: Current Every Day Smoker -- 0.50 packs/day for 10 years    Types: Cigarettes  . Smokeless tobacco: Not on file  . Alcohol Use: 1.2 oz/week    2 Cans of beer per week     Comment: very rarely    FAMILY HISTORY: Family History  Problem Relation Age of Onset  . Depression Mother   . Diabetes Mother   . Hypertension Mother   . Heart disease Father     before age 7    EXAM: BP 142/75 mmHg  Pulse 96  Temp(Src) 99.2 F (37.3 C) (Oral)  Resp 18  SpO2 97% CONSTITUTIONAL: Alert and oriented and responds appropriately to questions. Well-appearing; well-nourished HEAD: Normocephalic EYES: Conjunctivae clear, PERRL ENT: normal nose; no rhinorrhea; moist mucous membranes NECK: Supple, no meningismus, no LAD   CARD: RRR; S1 and S2 appreciated; no murmurs, no clicks, no rubs, no gallops RESP: Normal chest excursion without splinting or tachypnea; breath sounds clear and equal bilaterally; no wheezes, no rhonchi, no rales, no hypoxia or respiratory distress, speaking full sentences ABD/GI: Normal bowel sounds; non-distended; soft, non-tender, no rebound, no guarding, no peritoneal signs BACK:  The back appears normal and is non-tender to palpation, there is no CVA tenderness EXT: Pt has a 6 x 8 cm fluctuant area to the right mid posterior forearm with 4-5 cm of surrounding erythema, warmth and induration. There is no sign of joint effusion or septic arthritis. 2+ radial pulse bilaterally. Normal sensation diffusely throughout both upper extremities. Tender to palpation over this area on the right forearm. Normal ROM in all joints; otherwise extremity's are non-tender to palpation; no edema; normal capillary refill; no cyanosis, no calf tenderness or swelling    SKIN: Normal color for age and race; warm; no rash NEURO: Moves all extremities equally, sensation to light touch intact diffusely, cranial nerves II through XII intact PSYCH: The patient's mood and manner are appropriate. Grooming and personal hygiene are appropriate.  MEDICAL DECISION MAKING: Patient here with abscess and cellulitis to the right forearm. Hemodynamically stable. No leukocytosis. He is not diabetic or immunocompromised. Lactate was mildly elevated. We have opened the wound and sent a wound culture. There was a large amount of purulent drainage. Iodoform packing  has been placed and patient will pull this out on day 3 and return to the emergency room after his PCPs office for reevaluation. We have outlined the area of cellulitis with a tissue marker.  He has received vancomycin and ceftriaxone. We'll discharge on Keflex and Bactrim. Repeat lactic is normal after IV fluids.  I feel he is safe to be discharged. Have discussed with patient and  his mother at bedside that I do not feel comfortable discharging him with narcotics given his history of heroin abuse and I feel that this could do more harm than good and can be life-threatening to him if he were to overdose on narcotic medication. Have recommended alternating Tylenol and ibuprofen and he is okay with this plan. He states he wants resources to stop using heroin. Will provide him with a resource guide. Discussed return precautions and again recommended that he return to the emergency department, urgent care or to her primary care physician's office in 3 days for reevaluation. He verbalized understanding and is comfortable with this plan.     INCISION AND DRAINAGE Performed by: Raelyn NumberWARD, KRISTEN N Consent: Verbal consent obtained. Risks and benefits: risks, benefits and alternatives were discussed Type: abscess  Body area: Right forearm  Anesthesia: local infiltration  Incision was made with a scalpel.  Local anesthetic: lidocaine 2 % without epinephrine  Anesthetic total: 7 ml  Complexity: complex Blunt dissection to break up loculations  Drainage: purulent  Drainage amount: Large   Packing material: 1/4 in iodoform gauze  Patient tolerance: Patient tolerated the procedure well with no immediate complications.   Layla MawKristen N Ward, DO 01/01/16 [phone]

## 2016-01-04 LAB — CULTURE, ROUTINE-ABSCESS

## 2016-01-05 ENCOUNTER — Telehealth (HOSPITAL_BASED_OUTPATIENT_CLINIC_OR_DEPARTMENT_OTHER): Payer: Self-pay | Admitting: Emergency Medicine

## 2016-01-05 NOTE — Telephone Encounter (Signed)
Post ED Visit - Positive Culture Follow-up  Culture report reviewed by antimicrobial stewardship pharmacist:  []  Enzo BiNathan Batchelder, Pharm.D. []  Celedonio MiyamotoJeremy Frens, Pharm.D., BCPS [x]  Garvin FilaMike Maccia, Pharm.D. []  Georgina PillionElizabeth Martin, Pharm.D., BCPS []  St. PaulMinh Pham, [address] BoulevardPharm.D., BCPS, AAHIVP []  Estella HuskMichelle Turner, Pharm.D., BCPS, AAHIVP []  Tennis Mustassie Stewart, Pharm.D. []  Sherle Poeob Vincent, [address]Pharm[address]  Positive abcess culture Treated with bactrim DS and cephalexin organism sensitive to the same and no further patient follow-up is required at this time.  Berle MullMiller, Tyjanae Bartek 01/05/2016, 12:40 PM

## 2018-05-22 ENCOUNTER — Other Ambulatory Visit: Payer: Self-pay

## 2018-05-22 ENCOUNTER — Inpatient Hospital Stay (HOSPITAL_COMMUNITY)
Admission: EM | Admit: 2018-05-22 | Discharge: 2018-05-24 | DRG: 603 | Disposition: A | Discharge status: Home / Self Care | Payer: No Typology Code available for payment source | Attending: Internal Medicine | Admitting: Internal Medicine

## 2018-05-22 ENCOUNTER — Encounter (HOSPITAL_COMMUNITY): Payer: Self-pay | Admitting: Emergency Medicine

## 2018-05-22 DIAGNOSIS — L02414 Cutaneous abscess of left upper limb: Principal | ICD-10-CM | POA: Diagnosis present

## 2018-05-22 DIAGNOSIS — F1590 Other stimulant use, unspecified, uncomplicated: Secondary | ICD-10-CM | POA: Diagnosis present

## 2018-05-22 DIAGNOSIS — L03114 Cellulitis of left upper limb: Secondary | ICD-10-CM | POA: Diagnosis present

## 2018-05-22 DIAGNOSIS — L0291 Cutaneous abscess, unspecified: Secondary | ICD-10-CM

## 2018-05-22 DIAGNOSIS — F1120 Opioid dependence, uncomplicated: Secondary | ICD-10-CM | POA: Diagnosis present

## 2018-05-22 HISTORY — DX: Other psychoactive substance abuse, uncomplicated: F19.10

## 2018-05-22 HISTORY — DX: Unspecified asthma, uncomplicated: J45.909

## 2018-05-22 HISTORY — DX: Personal history of other diseases of the musculoskeletal system and connective tissue: Z87.39

## 2018-05-22 LAB — CBC WITH DIFFERENTIAL/PLATELET
Abs Immature Granulocytes: 0 10*3/uL (ref 0.0–0.1)
Basophils Absolute: 0 10*3/uL (ref 0.0–0.1)
Basophils Relative: 0 %
Eosinophils Absolute: 0.1 10*3/uL (ref 0.0–0.7)
Eosinophils Relative: 2 %
HCT: 41.2 % (ref 39.0–52.0)
Hemoglobin: 12.9 g/dL — ABNORMAL LOW (ref 13.0–17.0)
Immature Granulocytes: 0 %
Lymphocytes Relative: 23 %
Lymphs Abs: 1.5 10*3/uL (ref 0.7–4.0)
MCH: 28.5 pg (ref 26.0–34.0)
MCHC: 31.3 g/dL (ref 30.0–36.0)
MCV: 91.2 fL (ref 78.0–100.0)
Monocytes Absolute: 0.5 10*3/uL (ref 0.1–1.0)
Monocytes Relative: 7 %
Neutro Abs: 4.6 10*3/uL (ref 1.7–7.7)
Neutrophils Relative %: 68 %
Platelets: 279 10*3/uL (ref 150–400)
RBC: 4.52 MIL/uL (ref 4.22–5.81)
RDW: 12.5 % (ref 11.5–15.5)
WBC: 6.8 10*3/uL (ref 4.0–10.5)

## 2018-05-22 LAB — COMPREHENSIVE METABOLIC PANEL
ALT: 39 U/L (ref 0–44)
AST: 36 U/L (ref 15–41)
Albumin: 3.6 g/dL (ref 3.5–5.0)
Alkaline Phosphatase: 97 U/L (ref 38–126)
Anion gap: 10 (ref 5–15)
BUN: 6 mg/dL (ref 6–20)
CO2: 27 mmol/L (ref 22–32)
Calcium: 9.2 mg/dL (ref 8.9–10.3)
Chloride: 98 mmol/L (ref 98–111)
Creatinine, Ser: 0.93 mg/dL (ref 0.61–1.24)
GFR calc Af Amer: >60 mL/min (ref >60)
GFR calc non Af Amer: >60 mL/min (ref >60)
Glucose, Bld: 144 mg/dL — ABNORMAL HIGH (ref 70–99)
Potassium: 4.4 mmol/L (ref 3.5–5.1)
Sodium: 135 mmol/L (ref 135–145)
Total Bilirubin: 0.6 mg/dL (ref 0.3–1.2)
Total Protein: 8.8 g/dL — ABNORMAL HIGH (ref 6.5–8.1)

## 2018-05-22 LAB — I-STAT CG4 LACTIC ACID, ED: Lactic Acid, Venous: 1.29 mmol/L (ref 0.5–1.9)

## 2018-05-22 MED ORDER — KETAMINE HCL 50 MG/5ML IJ SOSY
0.3000 mg/kg | PREFILLED_SYRINGE | Freq: Once | INTRAMUSCULAR | Status: AC
  Administered 2018-05-22: 24 mg via INTRAVENOUS
  Filled 2018-05-22: qty 5

## 2018-05-22 MED ORDER — PIPERACILLIN-TAZOBACTAM 3.375 G IVPB 30 MIN
3.3750 g | Freq: Once | INTRAVENOUS | Status: AC
  Administered 2018-05-22: 3.375 g via INTRAVENOUS
  Filled 2018-05-22: qty 50

## 2018-05-22 MED ORDER — VANCOMYCIN HCL 10 G IV SOLR
1500.0000 mg | Freq: Two times a day (BID) | INTRAVENOUS | Status: DC
  Administered 2018-05-23 – 2018-05-24 (×3): 1500 mg via INTRAVENOUS
  Filled 2018-05-22 (×3): qty 1500

## 2018-05-22 MED ORDER — ENOXAPARIN SODIUM 40 MG/0.4ML ~~LOC~~ SOLN
40.0000 mg | SUBCUTANEOUS | Status: DC
  Administered 2018-05-22 – 2018-05-23 (×2): 40 mg via SUBCUTANEOUS
  Filled 2018-05-22 (×2): qty 0.4

## 2018-05-22 MED ORDER — NICOTINE 14 MG/24HR TD PT24
14.0000 mg | MEDICATED_PATCH | Freq: Every day | TRANSDERMAL | Status: DC
  Administered 2018-05-23 – 2018-05-24 (×2): 14 mg via TRANSDERMAL
  Filled 2018-05-22 (×2): qty 1

## 2018-05-22 MED ORDER — ACETAMINOPHEN 500 MG PO TABS
1000.0000 mg | ORAL_TABLET | Freq: Three times a day (TID) | ORAL | Status: DC
  Administered 2018-05-22 – 2018-05-24 (×6): 1000 mg via ORAL
  Filled 2018-05-22 (×6): qty 2

## 2018-05-22 MED ORDER — IBUPROFEN 400 MG PO TABS
400.0000 mg | ORAL_TABLET | Freq: Three times a day (TID) | ORAL | Status: DC
  Administered 2018-05-22 – 2018-05-24 (×6): 400 mg via ORAL
  Filled 2018-05-22 (×6): qty 1

## 2018-05-22 MED ORDER — VANCOMYCIN HCL 10 G IV SOLR
1750.0000 mg | Freq: Once | INTRAVENOUS | Status: AC
  Administered 2018-05-22: 1750 mg via INTRAVENOUS
  Filled 2018-05-22: qty 1750

## 2018-05-22 MED ORDER — DOXYCYCLINE HYCLATE 100 MG PO TABS
100.0000 mg | ORAL_TABLET | Freq: Once | ORAL | Status: AC
  Administered 2018-05-22: 100 mg via ORAL
  Filled 2018-05-22: qty 1

## 2018-05-22 MED ORDER — LIDOCAINE-EPINEPHRINE (PF) 2 %-1:200000 IJ SOLN
20.0000 mL | Freq: Once | INTRAMUSCULAR | Status: AC
  Administered 2018-05-22: 20 mL
  Filled 2018-05-22: qty 20

## 2018-05-22 MED ORDER — SODIUM CHLORIDE 0.9% FLUSH
3.0000 mL | Freq: Two times a day (BID) | INTRAVENOUS | Status: DC
  Administered 2018-05-22 – 2018-05-23 (×2): 3 mL via INTRAVENOUS

## 2018-05-22 NOTE — ED Provider Notes (Signed)
..  Incision and Drainage Date/Time: 05/22/2018 11:26 PM Performed by: Talitha GivensAshburn, Mishael Haran, MD Authorized by: Linwood DibblesKnapp, Jon, MD   Consent:    Consent obtained:  Verbal   Consent given by:  Patient   Risks discussed:  Bleeding, pain, infection, damage to other organs and incomplete drainage   Alternatives discussed:  No treatment Location:    Type:  Abscess   Size:  5cm   Location:  Upper extremity   Upper extremity location:  Arm   Arm location:  L lower arm Pre-procedure details:    Skin preparation:  Chloraprep Sedation:    Sedation type: Pain dose ketamine. Anesthesia (see MAR for exact dosages):    Anesthesia method:  Local infiltration   Local anesthetic:  Lidocaine 1% WITH epi Procedure type:    Complexity:  Complex Procedure details:    Incision types:  Single straight   Scalpel blade:  11   Wound management:  Probed and deloculated, irrigated with saline and extensive cleaning   Drainage:  Purulent   Drainage amount:  Copious   Wound treatment:  Wound left open   Packing materials:  None Post-procedure details:    Patient tolerance of procedure:  Tolerated well, no immediate complications Comments:     Performed under ultrasound guidance.  Significant purulent drainage.  Irrigated and suctioned.      Talitha GivensAshburn, Maie Kesinger, MD 05/22/18 [phone]    Linwood DibblesKnapp, Jon, MD 05/22/18 [phone]

## 2018-05-22 NOTE — ED Provider Notes (Signed)
[name] EMERGENCY DEPARTMENT Provider Note   CSN: [id number] Arrival date & time: 05/22/18  1508     History   Chief Complaint Chief Complaint  Patient presents with  . Abscess    HPI Douglas Morgan is a 32 y.o. male.  HPI Patient presents to the emergency room for evaluation of swelling and drainage from a left forearm wound.  Patient admits to IV drug use.  He indicates he is injected into that area previously.  Patient started noticing swelling and redness about a week ago.  In the last several days it is gotten worse in a couple of days ago it started spontaneously draining.  Patient continues to have pain.  He denies any fevers.  He denies any numbness or weakness.  No chest pain or shortness of breath. Past Medical History:  Diagnosis Date  . Leg pain   . Ulcer   . Varicose veins     Patient Active Problem List   Diagnosis Date Noted  . Hypoglycemia 12/26/2013  . Leukocytosis, unspecified 12/26/2013  . Fracture of iliac crest (HCC) 12/25/2013  . Varicose veins of lower extremities with other complications 11/14/2013  . Varicose veins of lower extremities with ulcer (HCC) 08/14/2013  . Varicose ulcer (HCC) 07/04/2013    History reviewed. No pertinent surgical history.      Home Medications    Prior to Admission medications   Medication Sig Start Date End Date Taking? Authorizing Provider  methadone (DOLOPHINE) 10 MG/ML solution Take 120 mg by mouth daily.   Yes [provider]  ibuprofen (ADVIL,MOTRIN) 800 MG tablet Take 1 tablet (800 mg total) by mouth every 8 (eight) hours as needed for mild pain. Patient not taking: Reported on 05/22/2018 01/01/16   Ward, Layla Maw, DO    Family History Family History  Problem Relation Age of Onset  . Depression Mother   . Diabetes Mother   . Hypertension Mother   . Heart disease Father        before age 52    Social History Social History   Tobacco Use  . Smoking status: Current  Every Day Smoker    Packs/day: 0.50    Years: 10.00    Pack years: 5.00    Types: Cigarettes  Substance Use Topics  . Alcohol use: Not Currently    Alcohol/week: 1.2 oz    Types: 2 Cans of beer per week    Frequency: Never    Comment: very rarely  . Drug use: Yes    Types: Marijuana, Cocaine, Benzodiazepines    Comment: heroin     Allergies   Patient has no known allergies.   Review of Systems Review of Systems  All other systems reviewed and are negative.    Physical Exam Updated Vital Signs BP 112/66   Pulse 72   Temp 98.5 F (36.9 C) (Oral)   Resp 17   Ht 1.93 m (6\' 4" )   Wt 81.6 kg (180 lb)   SpO2 100%   BMI 21.91 kg/m   Physical Exam  Constitutional: He appears well-developed and well-nourished. No distress.  HENT:  Head: Normocephalic and atraumatic.  Right Ear: External ear normal.  Left Ear: External ear normal.  Eyes: Conjunctivae are normal. Right eye exhibits no discharge. Left eye exhibits no discharge. No scleral icterus.  Neck: Neck supple. No tracheal deviation present.  Cardiovascular: Normal rate, regular rhythm and normal heart sounds.  Pulmonary/Chest: Effort normal and breath sounds normal. No stridor.  No respiratory distress.  Abdominal: He exhibits no distension.  Musculoskeletal: He exhibits edema and tenderness.  Spontaneously draining wound in the proximal forearm of the left arm, surrounding erythema and induration, no lymphangitic streaking, no pain with pronation or supination, distal neurovascularly intact  Neurological: He is alert. Cranial nerve deficit: no gross deficits.  Skin: Skin is warm and dry. No rash noted.  Psychiatric: He has a normal mood and affect.  Nursing note and vitals reviewed.    ED Treatments / Results  Labs (all labs ordered are listed, but only abnormal results are displayed) Labs Reviewed  COMPREHENSIVE METABOLIC PANEL - Abnormal; Notable for the following components:      Result Value   Glucose,  Bld 144 (*)    Total Protein 8.8 (*)    All other components within normal limits  CBC WITH DIFFERENTIAL/PLATELET - Abnormal; Notable for the following components:   Hemoglobin 12.9 (*)    All other components within normal limits  CULTURE, BLOOD (ROUTINE X 2)  CULTURE, BLOOD (ROUTINE X 2)  I-STAT CG4 LACTIC ACID, ED  I-STAT CG4 LACTIC ACID, ED    EKG None  Radiology No results found.  Procedures Procedures (including critical care time)  Medications Ordered in ED Medications  piperacillin-tazobactam (ZOSYN) IVPB 3.375 g (0 g Intravenous Stopped 05/22/18 1800)  doxycycline (VIBRA-TABS) tablet 100 mg (100 mg Oral Given 05/22/18 1734)  lidocaine-EPINEPHrine (XYLOCAINE W/EPI) 2 %-1:200000 (PF) injection 20 mL (20 mLs Infiltration Given 05/22/18 1948)  ketamine 50 mg in normal saline 5 mL (10 mg/mL) syringe (24 mg Intravenous Given 05/22/18 1810)  ketamine 50 mg in normal saline 5 mL (10 mg/mL) syringe (24 mg Intravenous Given 05/22/18 1948)     Initial Impression / Assessment and Plan / ED Course  I have reviewed the triage vital signs and the nursing notes.  Pertinent labs & imaging results that were available during my care of the patient were reviewed by me and considered in my medical decision making (see chart for details).    Patient's laboratory tests are reassuring.  No signs of sepsis.  Patient unfortunately has a rather large abscess in his left upper extremity related to his intravenous drug use.  Patient required I&D in the emergency room.  The wound was copiously irrigated but the patient had significant pain during the procedure.  Considering the degree of the abscess and surrounding cellulitis I think is reasonable to bring him in for IV antibiotics and continued wound care.  I will consult with medical service  Final Clinical Impressions(s) / ED Diagnoses   Final diagnoses:  Abscess  Cellulitis of left upper extremity      Linwood DibblesKnapp, Carissa Musick, MD 05/22/18 2012

## 2018-05-22 NOTE — Progress Notes (Signed)
ANTIBIOTIC CONSULT NOTE - INITIAL  Pharmacy Consult for Vancomycin Indication: cellulitis/left forearm wound  No Known Allergies  Patient Measurements: Height: 6\' 4"  (193 cm) Weight: 180 lb (81.6 kg) IBW/kg (Calculated) : 86.8   Vital Signs: Temp: 98.7 F (37.1 C) (08/04 2048) Temp Source: Oral (08/04 2048) BP: 103/77 (08/04 2100) Pulse Rate: 72 (08/04 2100) Intake/Output from previous day: No intake/output data recorded. Intake/Output from this shift: No intake/output data recorded.  Labs: Recent Labs    05/22/18 1525  WBC 6.8  HGB 12.9*  PLT 279  CREATININE 0.93   Estimated Creatinine Clearance: 134.1 mL/min (by C-G formula based on SCr of 0.93 mg/dL). No results for input(s): VANCOTROUGH, VANCOPEAK, VANCORANDOM, GENTTROUGH, GENTPEAK, GENTRANDOM, TOBRATROUGH, TOBRAPEAK, TOBRARND, AMIKACINPEAK, AMIKACINTROU, AMIKACIN in the last 72 hours.   Microbiology: No results found for this or any previous visit (from the past 720 hour(s)).  Medical History: Past Medical History:  Diagnosis Date  . Leg pain   . Ulcer   . Varicose veins      Assessment: 32 yo male with h/o IVDA with left forearm wound who is s/p I&D of an abscess. Pharmacy has been consulted to dose vancomycin.   Goal of Therapy:  Vancomycin trough level 10-15 mcg/ml  Plan:  Vancomycin 1750mg  IV x 1 Vancomycin 1500mg  IV q12h VT as needed Monitor for clinical course, C&S, and LOT Monitor renal function.   Elene Downum A. Jeanella CrazePierce, PharmD, BCPS Clinical Pharmacist [place] Pager: [phone] Please utilize Amion for appropriate phone number to reach the unit pharmacist [facility])   05/22/2018,9:35 PM

## 2018-05-22 NOTE — H&P (Signed)
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  History is obtained via patient report, review of EMR, and mother reported bedside. Patient reports last using methamphetamine last week, intravenously. He noticed some left forearm pain 4 days ago with swelling. This progressed to continued enlargement and eventual expression of purulent fluid the day of admission. The prompting of family members he sought medical attention. He reports pain, redness, mild limitation range of motion of his left elbow due to the pain. He reports one other episode of an abscess that had to be drained in a different location. He denies any fevers, chills, nausea, vomiting, constipation, chest pain, shortness of breath. He is not been on antibiotics recently.    At baseline, he lives with his mother at home. Formerly he worked as a Psychologist, occupational, he reports having to stop this since sustaining a traumatic injury to his iliac. He denies alcohol use, reports graduating high school, reports enjoying physical activity, reports being independent in his ADLs.  ED Course: in the emergency department, he was noted to have a large purulent abscess on left dorsal forearm. Verbal poor with the emergency medicine team reports that abscess cavity was up to 8 cm in diameter. Incision and drainage was performed resulting in generous amount of purulent fluid. Diagnostics obtained included CBC which was normal with exception of hemoglobin of 12.9, CMP with glucose of 144, total protein 8.8. Lactic acid normal. Vital signs initially with tachycardia 109, normal respiratory rate, systolic blood pressure over 120.  The patient was given  IV ketamine peri-procedurally. He was given by mouth doxycycline as well as IV Pepcid and tazobactam.Hospital medicine consulted for further management given large abscess with surrounding cellulitis.  Review of Systems: A complete ROS was obtained; pertinent positives negatives are denoted in the HPI. Otherwise, all systems are negative.   Past Medical History:  Diagnosis Date  . Leg pain   . Ulcer   . Varicose veins    Social History   Socioeconomic History  . Marital status: Single    Spouse name: Not on file  . Number of children: Not on file  . Years of education: Not on file  . Highest education level: Not on file  Occupational History  . Not on file  Social Needs  . Financial resource strain: Not on file  . Food insecurity:    Worry: Not on file    Inability: Not on file  . Transportation needs:    Medical: Not on file    Non-medical: Not on file  Tobacco Use  . Smoking status: Current Every Day Smoker    Packs/day: 0.50    Years: 10.00    Pack years: 5.00    Types: Cigarettes  Substance and Sexual Activity  . Alcohol use: Not Currently    Alcohol/week: 1.2 oz    Types: 2 Cans of beer per week    Frequency: Never    Comment: very rarely  . Drug use: Yes    Types: Marijuana, Cocaine, Benzodiazepines    Comment: heroin  . Sexual activity: Yes    Comment: partner on birth control  Lifestyle  . Physical activity:    Days per week: Not on file  Minutes per session: Not on file  . Stress: Not on file  Relationships  . Social connections:    Talks on phone: Not on file    Gets together: Not on file    Attends religious service: Not on file    Active member of club or organization: Not on file    Attends meetings of clubs or organizations: Not on file    Relationship status: Not on file  . Intimate partner violence:    Fear of current or ex partner: Not on file    Emotionally abused: Not on file    Physically abused: Not on file    Forced sexual  activity: Not on file  Other Topics Concern  . Not on file  Social History Narrative  . Not on file   Family History  Problem Relation Age of Onset  . Depression Mother   . Diabetes Mother   . Hypertension Mother   . Heart disease Father        before age 160    Physical Exam: Vitals:   05/22/18 2000 05/22/18 2030 05/22/18 2048 05/22/18 2100  BP: (!) 125/58 130/82 126/78 103/77  Pulse: 86 69 72 72  Resp: 16 16 18 18   Temp:   98.7 F (37.1 C)   TempSrc:   Oral   SpO2: 100% 100%  98%  Weight:      Height:       General: Appears calm and comfortable, thin, white young man ENT: Grossly normal hearing, MMM. Poor dentition. Cardiovascular: RRR. No M/R/G. No LE edema.  Respiratory: CTA bilaterally. No wheezes or crackles. Normal respiratory effort. Breathing room air. Abdomen: Soft, non-tender.  Skin: Left dorsal forearm (just distal to elbow) with ~ 4 cm x 4 cm open wound (opening of prior abscess cavity, filled with dried blood), surrounding erythema and swelling, painful to touch Musculoskeletal: Grossly normal tone BUE/BLE. Appropriate ROM with exception of left elbow with mild reduction in ROM 2/2 to pain Psychiatric: Grossly normal mood and affect. Neurologic: Moves all extremities in coordinated fashion.  I have personally reviewed the following labs, culture data, and imaging studies.  Assessment/Plan:  #Left forearm skin and soft tissue infection  Course: likely etiology of infection - IV drug use; prior to admission - large abscess (~8 cm) incised and drained; no fever, leukocytosis, or symptoms to suggest sepsis.  A/P: Large abscess size with surrounding erythema warrants antimicrobial therapy and given what may be rapidly progressive erythema in close proximity to left elbow - IV antimicrobials warranted. Will continue IV antimicrobial therapy directed against MRSA pathogen (given purulence) with vancomycin therapy. Have asked nursing to outline with skin marking pen  for assessment of erythema, elevation of extremity to help with swelling; scheduled acetaminophen + ibuprofen for pain adjuncts.  Wound care team to help optimize topical wound dressing.    #Other problems: -Chronic opioid dependnece: patient reports going to methadone clinic daily, reports being on a dose of 120 mg daily for > 3 weeks; will await confirmation with methadone clinic of this dose prior to initiating, given prolonged half life of methadone - likely therapeutic effect will not be lost -Smoking:continue cessation efforts, nicotine patch -Polysubstance abuse: work with multidisciplinary team to optimize support / counseling to optimize; HIV and hepatitis panel  DVT prophylaxis: Subq Lovenox Code Status: Full code Disposition Plan: Anticipate D/C home possibly as early as tomorrow if clinical improvement ensues Consults called: wound care team (ordered) Admission status: admit to hospital medicine service  Laurell Roof, MD [facility] [phone]  If 7PM-7AM, please contact night-coverage [email or web address] Password [email or web address]

## 2018-05-22 NOTE — ED Triage Notes (Signed)
Pt presents with large open abscess to L forearm x 1 wk ago and opened yesterday; purulent drainage noted from wound; area of redness around wound with warmth and patient reports pain; hx of IV drug use

## 2018-05-23 DIAGNOSIS — L03114 Cellulitis of left upper limb: Secondary | ICD-10-CM | POA: Diagnosis not present

## 2018-05-23 DIAGNOSIS — L0291 Cutaneous abscess, unspecified: Secondary | ICD-10-CM

## 2018-05-23 DIAGNOSIS — F1910 Other psychoactive substance abuse, uncomplicated: Secondary | ICD-10-CM

## 2018-05-23 LAB — HIV ANTIBODY (ROUTINE TESTING W REFLEX): HIV Screen 4th Generation wRfx: NONREACTIVE

## 2018-05-23 LAB — COMPREHENSIVE METABOLIC PANEL
ALT: 33 U/L (ref 0–44)
AST: 30 U/L (ref 15–41)
Albumin: 3 g/dL — ABNORMAL LOW (ref 3.5–5.0)
Alkaline Phosphatase: 85 U/L (ref 38–126)
Anion gap: 7 (ref 5–15)
BUN: 10 mg/dL (ref 6–20)
CO2: 27 mmol/L (ref 22–32)
Calcium: 8.5 mg/dL — ABNORMAL LOW (ref 8.9–10.3)
Chloride: 102 mmol/L (ref 98–111)
Creatinine, Ser: 0.89 mg/dL (ref 0.61–1.24)
GFR calc Af Amer: >60 mL/min (ref >60)
GFR calc non Af Amer: >60 mL/min (ref >60)
Glucose, Bld: 122 mg/dL — ABNORMAL HIGH (ref 70–99)
Potassium: 4 mmol/L (ref 3.5–5.1)
Sodium: 136 mmol/L (ref 135–145)
Total Bilirubin: 0.5 mg/dL (ref 0.3–1.2)
Total Protein: 7.6 g/dL (ref 6.5–8.1)

## 2018-05-23 LAB — CBC
HCT: 35.8 % — ABNORMAL LOW (ref 39.0–52.0)
Hemoglobin: 11.2 g/dL — ABNORMAL LOW (ref 13.0–17.0)
MCH: 28.1 pg (ref 26.0–34.0)
MCHC: 31.3 g/dL (ref 30.0–36.0)
MCV: 89.9 fL (ref 78.0–100.0)
Platelets: 244 10*3/uL (ref 150–400)
RBC: 3.98 MIL/uL — ABNORMAL LOW (ref 4.22–5.81)
RDW: 12.4 % (ref 11.5–15.5)
WBC: 6.2 10*3/uL (ref 4.0–10.5)

## 2018-05-23 MED ORDER — METHADONE HCL 10 MG PO TABS
60.0000 mg | ORAL_TABLET | Freq: Every day | ORAL | Status: DC
  Administered 2018-05-23: 60 mg via ORAL
  Filled 2018-05-23: qty 6

## 2018-05-23 MED ORDER — ONDANSETRON HCL 4 MG/2ML IJ SOLN
4.0000 mg | Freq: Four times a day (QID) | INTRAMUSCULAR | Status: DC | PRN
  Filled 2018-05-23: qty 2

## 2018-05-23 NOTE — Progress Notes (Signed)
Spoke with Lyla SonCarrie in the pharmacy concerning home med dose for patients Methadone.

## 2018-05-23 NOTE — Progress Notes (Signed)
Progress Note    Douglas B Height  WUJ:[id number] DOB: 1985/12/29  DOA: 05/22/2018 PCP: Patient, No Pcp Per    Brief Narrative:     Medical records reviewed and are as summarized below:  Douglas Morgan is an 32 y.o. 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  History is obtained via patient report, review of EMR, and mother reported bedside. Patient reports last using methamphetamine last week, intravenously. He noticed some left forearm pain 4 days ago with swelling. This progressed to continued enlargement and eventual expression of purulent fluid the day of admission. The prompting of family members he sought medical attention. He reports pain, redness, mild limitation range of motion of his left elbow due to the pain. He reports one other episode of an abscess that had to be drained in a different location. He denies any fevers, chills, nausea, vomiting, constipation, chest pain, shortness of breath. He is not been on antibiotics recently.      Assessment/Plan:   Active Problems:   Cellulitis of left arm  Left forearm skin and soft tissue infection  -due to IV drug use - large abscess (~8 cm) incised and drained-- no culture sent after I/D -suspect MRSA - IV vanc -await blood cultures before discharge -scheduled acetaminophen + ibuprofen for pain adjuncts -resume methadone when dose verified -WOC  Chronic opioid dependnece: patient reports going to methadone clinic daily, reports being on a dose of 120 mg daily for > 3 weeks; will await confirmation with methadone clinic of this dose prior to initiating  Tobacco abuse -continue cessation efforts -nicotine patch  Polysubstance abuse: -HIV and hepatitis panel pending     Family Communication/Anticipated D/C date and plan/Code Status   DVT prophylaxis: Lovenox  ordered. Code Status: Full Code.  Family Communication: mom at bedside Disposition Plan: pending improvement   Medical Consultants:    None.     Subjective:   C/o soreness in left arm but overall feels better  Objective:    Vitals:   05/22/18 2100 05/22/18 2130 05/22/18 2216 05/23/18 0524  BP: 103/77 124/75 137/82 116/67  Pulse: 72 67 71 (!) 58  Resp: 18 18  16   Temp:   98.6 F (37 C) 97.8 F (36.6 C)  TempSrc:   Oral Oral  SpO2: 98% 100% 100% 100%  Weight:      Height:       No intake or output data in the 24 hours ending 05/23/18 0819 Filed Weights   05/22/18 1514  Weight: 81.6 kg (180 lb)    Exam: In bed, NAD rrr Clear, no increased work of breathing No LE Edema Left arm wrapped  Data Reviewed:   I have personally reviewed following labs and imaging studies:  Labs: Labs show the following:   Basic Metabolic Panel: Recent Labs  Lab 05/22/18 1525 05/23/18 0623  NA 135 136  K 4.4 4.0  CL 98 102  CO2 27 27  GLUCOSE 144* 122*  BUN 6 10  CREATININE 0.93 0.89  CALCIUM 9.2 8.5*   GFR Estimated Creatinine Clearance: 140.1 mL/min (by C-G formula based on SCr of 0.89 mg/dL). Liver Function Tests: Recent Labs  Lab 05/22/18 1525 05/23/18 0623  AST 36 30  ALT 39 33  ALKPHOS 97 85  BILITOT 0.6 0.5  PROT 8.8* 7.6  ALBUMIN 3.6 3.0*   No results for input(s): LIPASE,  AMYLASE in the last 168 hours. No results for input(s): AMMONIA in the last 168 hours. Coagulation profile No results for input(s): INR, PROTIME in the last 168 hours.  CBC: Recent Labs  Lab 05/22/18 1525 05/23/18 0623  WBC 6.8 6.2  NEUTROABS 4.6  --   HGB 12.9* 11.2*  HCT 41.2 35.8*  MCV 91.2 89.9  PLT 279 244   Cardiac Enzymes: No results for input(s): CKTOTAL, CKMB, CKMBINDEX, TROPONINI in the last 168 hours. BNP (last 3 results) No results for input(s): PROBNP in the last 8760 hours. CBG: No results for input(s): GLUCAP in the last 168 hours. D-Dimer: No  results for input(s): DDIMER in the last 72 hours. Hgb A1c: No results for input(s): HGBA1C in the last 72 hours. Lipid Profile: No results for input(s): CHOL, HDL, LDLCALC, TRIG, CHOLHDL, LDLDIRECT in the last 72 hours. Thyroid function studies: No results for input(s): TSH, T4TOTAL, T3FREE, THYROIDAB in the last 72 hours.  Invalid input(s): FREET3 Anemia work up: No results for input(s): VITAMINB12, FOLATE, FERRITIN, TIBC, IRON, RETICCTPCT in the last 72 hours. Sepsis Labs: Recent Labs  Lab 05/22/18 1525 05/22/18 1848 05/23/18 0623  WBC 6.8  --  6.2  LATICACIDVEN  --  1.29  --     Microbiology No results found for this or any previous visit (from the past 240 hour(s)).  Procedures and diagnostic studies:  No results found.  Medications:   . acetaminophen  1,000 mg Oral TID  . enoxaparin (LOVENOX) injection  40 mg Subcutaneous Q24H  . ibuprofen  400 mg Oral Q8H  . nicotine  14 mg Transdermal Daily  . sodium chloride flush  3 mL Intravenous Q12H   Continuous Infusions: . vancomycin       LOS: 0 days   Joseph ArtJessica U Ellarie Picking  [facility]   *Please refer to [email or web address], password [email or web address] to get updated schedule on who will round on this patient, as hospitalists switch teams weekly. If 7PM-7AM, please contact night-coverage at [email or web address], password [email or web address] for any overnight needs.  05/23/2018, 8:19 AM

## 2018-05-23 NOTE — Consult Note (Signed)
WOC Nurse wound consult note Reason for Consult: I & D of infectious IV site for methamphetamine.   Wound type:Trauma Pressure Injury POA: NA Measurement: 2 cm x 2 cm x 0.3 cm wound.  Covered with clotted fibrous devitalized tissue. S/P I & D Wound bed:see above Drainage (amount, consistency, odor) moderate serosanguinous Periwound:Erytehma and tenderness Dressing procedure/placement/frequency:Cleanse wound to left arm with NS.  Apply Aquacel Ag to wound bed.  Cover with gauze and kerlix.  Change daily.  Will not follow at this time.  Please re-consult if needed.  Maple HudsonKaren Amal Renbarger RN BSN [facility] Pager [phone]

## 2018-05-24 ENCOUNTER — Encounter (HOSPITAL_COMMUNITY): Payer: Self-pay | Admitting: General Practice

## 2018-05-24 DIAGNOSIS — L03114 Cellulitis of left upper limb: Secondary | ICD-10-CM | POA: Diagnosis not present

## 2018-05-24 DIAGNOSIS — B1920 Unspecified viral hepatitis C without hepatic coma: Secondary | ICD-10-CM | POA: Diagnosis not present

## 2018-05-24 LAB — HEPATITIS PANEL, ACUTE
HCV Ab: >11 s/co ratio — ABNORMAL HIGH (ref 0.0–0.9)
Hep A IgM: NEGATIVE
Hep B C IgM: NEGATIVE
Hepatitis B Surface Ag: NEGATIVE

## 2018-05-24 MED ORDER — NICOTINE 14 MG/24HR TD PT24: 14.0000 mg | MEDICATED_PATCH | Freq: Every day | TRANSDERMAL | 0 refills | Status: AC

## 2018-05-24 MED ORDER — METHADONE HCL 10 MG PO TABS
110.0000 mg | ORAL_TABLET | Freq: Every day | ORAL | Status: DC
  Administered 2018-05-24: 110 mg via ORAL
  Filled 2018-05-24: qty 11

## 2018-05-24 MED ORDER — SULFAMETHOXAZOLE-TRIMETHOPRIM 800-160 MG PO TABS
1.0000 | ORAL_TABLET | Freq: Two times a day (BID) | ORAL | Status: DC
  Administered 2018-05-24: 1 via ORAL
  Filled 2018-05-24: qty 1

## 2018-05-24 MED ORDER — IBUPROFEN 400 MG PO TABS: 400.0000 mg | ORAL_TABLET | Freq: Three times a day (TID) | ORAL | 0 refills | Status: AC

## 2018-05-24 MED ORDER — SULFAMETHOXAZOLE-TRIMETHOPRIM 800-160 MG PO TABS: 1.0000 | ORAL_TABLET | Freq: Two times a day (BID) | ORAL | 0 refills | Status: DC

## 2018-05-24 NOTE — Care Management Note (Addendum)
Case Management Note  Patient Details  Name: Douglas Morgan MRN: [id number] Date of Birth: 04-05-1986  Subjective/Objective:                    Action/Plan:MATCH letter given and explained. Patient and his mother voiced understanding  [facility]   [facility]) Expected Discharge Date:                  Expected Discharge Plan:  Home/Self Care  In-House Referral:     Discharge planning Services  CM Consult, Indigent Health Clinic  Post Acute Care Choice:  NA Choice offered to:     DME Arranged:  N/A DME Agency:  NA  HH Arranged:  NA HH Agency:  NA  Status of Service:  In process, will continue to follow  If discussed at Long Length of Stay Meetings, dates discussed:    Additional Comments:  Kingsley PlanWile, Valita Righter Marie, RN 05/24/2018, 2:20 PM

## 2018-05-24 NOTE — Progress Notes (Signed)
Patient discharged to home. Verbalizes understanding of all discharge instructions including incision care, discharge medications, and follow up MD visits. Patient accompanied by mother.  

## 2018-05-24 NOTE — Discharge Summary (Signed)
Physician Discharge Summary  [name] MVH:[id number] DOB: Dec 18, 1985 DOA: 05/22/2018  PCP: Douglas Morgan, No Pcp Per[facility]  Admit date: 05/22/2018 Discharge date: 05/24/2018  Admitted From: home Discharge disposition: home   Recommendations for Outpatient Follow-Up:   1. Wound check at next visit 2. Follow up Hepatitis C RNA-- refer to ID if needed   Discharge Diagnosis:   Active Problems:   Cellulitis of left arm    Discharge Condition: Improved.  Diet recommendation:Regular.  Wound care: Cleanse wound to left arm with NS.  Apply Aquacel Ag to wound bed.  Cover with gauze and kerlix.  Change daily.  Code status: Full.   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  History is obtained via Douglas Morgan report, review of EMR, and mother reported bedside. Douglas Morgan reports last using methamphetamine last week, intravenously. He noticed some left forearm pain 4 days ago with swelling. This progressed to continued enlargement and eventual expression of purulent fluid the day of admission. The prompting of family members he sought medical attention. He reports pain, redness, mild limitation range of motion of his left elbow due to the pain. He reports one other episode of an abscess that had to be drained in a different location. He denies any fevers, chills, nausea, vomiting, constipation, chest pain, shortness of breath. He is not been on antibiotics recently.    At baseline, he lives with his mother at home. Formerly he worked as a Psychologist, occupationalwelder, he reports having to stop this since sustaining a traumatic injury to his iliac. He denies alcohol use, reports graduating high school, reports enjoying physical activity, reports being independent in his  ADLs.     Hospital Course by Problem:    Left forearm skin and soft tissue infection  -due to IV drug use - large abscess (~8 cm) incised and drained-- no culture sent after I/D -suspect staph - IV vanc x 3 days now change to PO bactrim -BC NGTD -resume methadone   Chronic opioid dependnece:Douglas Morgan reports going to methadone clinic daily  Tobacco abuse -continue cessation efforts -nicotine patch  Polysubstance abuse: -HIV neg Hep C on viral panel positive- check RNA      Medical Consultants:      Discharge Exam:   Vitals:   05/23/18 2037 05/24/18 0525  BP: 124/63 118/63  Pulse: 63 63  Resp: 18 18  Temp: 98.4 F (36.9 C) 97.6 F (36.4 C)  SpO2: 100% 99%   Vitals:   05/23/18 0524 05/23/18 1331 05/23/18 2037 05/24/18 0525  BP: 116/67 125/70 124/63 118/63  Pulse: (!) 58 70 63 63  Resp: 16 18 18 18   Temp: 97.8 F (36.6 C) 98 F (36.7 C) 98.4 F (36.9 C) 97.6 F (36.4 C)  TempSrc: Oral Oral Oral Oral  SpO2: 100% 100% 100% 99%  Weight:      Height:        General exam: Appears calm and comfortable. Arm wrapped with no streaking of redness surrounding I/D site. Good joint movement w/o pain    The results of significant diagnostics from this hospitalization (including imaging, microbiology, ancillary and laboratory) are listed below for reference.     Procedures and Diagnostic Studies:   No results found.   Labs:   Basic Metabolic Panel: Recent Labs  Lab 05/22/18  1525 05/23/18 0623  NA 135 136  K 4.4 4.0  CL 98 102  CO2 27 27  GLUCOSE 144* 122*  BUN 6 10  CREATININE 0.93 0.89  CALCIUM 9.2 8.5*   GFR Estimated Creatinine Clearance: 140.1 mL/min (by C-G formula based on SCr of 0.89 mg/dL). Liver Function Tests: Recent Labs  Lab 05/22/18 1525 05/23/18 0623  AST 36 30  ALT 39 33  ALKPHOS 97 85  BILITOT 0.6 0.5  PROT 8.8* 7.6  ALBUMIN 3.6 3.0*   No results for input(s): LIPASE, AMYLASE in the last 168 hours. No results  for input(s): AMMONIA in the last 168 hours. Coagulation profile No results for input(s): INR, PROTIME in the last 168 hours.  CBC: Recent Labs  Lab 05/22/18 1525 05/23/18 0623  WBC 6.8 6.2  NEUTROABS 4.6  --   HGB 12.9* 11.2*  HCT 41.2 35.8*  MCV 91.2 89.9  PLT 279 244   Cardiac Enzymes: No results for input(s): CKTOTAL, CKMB, CKMBINDEX, TROPONINI in the last 168 hours. BNP: Invalid input(s): POCBNP CBG: No results for input(s): GLUCAP in the last 168 hours. D-Dimer No results for input(s): DDIMER in the last 72 hours. Hgb A1c No results for input(s): HGBA1C in the last 72 hours. Lipid Profile No results for input(s): CHOL, HDL, LDLCALC, TRIG, CHOLHDL, LDLDIRECT in the last 72 hours. Thyroid function studies No results for input(s): TSH, T4TOTAL, T3FREE, THYROIDAB in the last 72 hours.  Invalid input(s): FREET3 Anemia work up No results for input(s): VITAMINB12, FOLATE, FERRITIN, TIBC, IRON, RETICCTPCT in the last 72 hours. Microbiology Recent Results (from the past 240 hour(s))  Blood culture (routine x 2)     Status: None (Preliminary result)   Collection Time: 05/22/18  3:25 PM  Result Value Ref Range Status   Specimen Description BLOOD RIGHT ANTECUBITAL  Final   Special Requests   Final    BOTTLES DRAWN AEROBIC AND ANAEROBIC Blood Culture adequate volume   Culture   Final    NO GROWTH 2 DAYS Performed at [facility], [address] [place], [place] [address]    Report Status PENDING  Incomplete  Blood culture (routine x 2)     Status: None (Preliminary result)   Collection Time: 05/22/18  3:26 PM  Result Value Ref Range Status   Specimen Description BLOOD RIGHT HAND  Final   Special Requests   Final    BOTTLES DRAWN AEROBIC ONLY Blood Culture results may not be optimal due to an inadequate volume of blood received in culture bottles   Culture   Final    NO GROWTH 2 DAYS Performed at [facility], [address], [place], [place] [address]     Report Status PENDING  Incomplete     Discharge Instructions:   Discharge Instructions    Diet - low sodium heart healthy   Complete by:  As directed    Discharge instructions   Complete by:  As directed    Wound care: Wound care: Cleanse wound to left arm with NS.  Apply Aquacel Ag to wound bed.  Cover with gauze and kerlix.  Change daily.   Increase activity slowly   Complete by:  As directed      Allergies as of 05/24/2018   No Known Allergies     Medication List    TAKE these medications   ibuprofen 400 MG tablet Commonly known as:  ADVIL,MOTRIN Take 1 tablet (400 mg total) by mouth every 8 (eight)  hours.   methadone 10 MG/ML solution Commonly known as:  DOLOPHINE Take 110 mg by mouth daily.   nicotine 14 mg/24hr patch Commonly known as:  NICODERM CQ - dosed in mg/24 hours Place 1 patch (14 mg total) onto the skin daily. Start taking on:  05/25/2018   sulfamethoxazole-trimethoprim 800-160 MG tablet Commonly known as:  BACTRIM DS,SEPTRA DS Take 1 tablet by mouth every 12 (twelve) hours.      Follow-up Information    [facility] Follow up.   Specialty:  Internal Medicine Why:  Follow up appointment June 03, 2018 at 0920 am  Contact information: [address] [place] [address] [phone]           Time coordinating discharge: 35 min  Signed:  Joseph Art  [facility] 05/24/2018, 2:26 PM

## 2018-05-24 NOTE — Discharge Instructions (Signed)
Incision and Drainage, Care After Refer to this sheet in the next few weeks. These instructions provide you with information about caring for yourself after your procedure. Your health care provider may also give you more specific instructions. Your treatment has been planned according to current medical practices, but problems sometimes occur. Call your health care provider if you have any problems or questions after your procedure. What can I expect after the procedure? After the procedure, it is common to have:  Pain or discomfort around your incision site.  Drainage from your incision.  Follow these instructions at home:  Take over-the-counter and prescription medicines only as told by your health care provider.  If you were prescribed an antibiotic medicine, take it as told by your health care provider.Do not stop taking the antibiotic even if you start to feel better.  Followinstructions from your health care provider about: ? How to take care of your incision. ? When and how you should change your packing and bandage (dressing). Wash your hands with soap and water before you change your dressing. If soap and water are not available, use hand sanitizer. ? When you should remove your dressing.  Do not take baths, swim, or use a hot tub until your health care provider approves.  Keep all follow-up visits as told by your health care provider. This is important.  Check your incision area every day for signs of infection. Check for: ? More redness, swelling, or pain. ? More fluid or blood. ? Warmth. ? Pus or a bad smell. Contact a health care provider if:  Your cyst or abscess returns.  You have a fever.  You have more redness, swelling, or pain around your incision.  You have more fluid or blood coming from your incision.  Your incision feels warm to the touch.  You have pus or a bad smell coming from your incision. Get help right away if:  You have severe pain or  bleeding.  You cannot eat or drink without vomiting.  You have decreased urine output.  You become short of breath.  You have chest pain.  You cough up blood.  The area where the incision and drainage occurred becomes numb or it tingles. This information is not intended to replace advice given to you by your health care provider. Make sure you discuss any questions you have with your health care provider. Document Released: 12/28/2011 Document Revised: 03/06/2016 Document Reviewed: 07/26/2015 Elsevier Interactive Patient Education  2018 [employer]   Cellulitis, Adult Cellulitis is a skin infection. The infected area is usually red and sore. This condition occurs most often in the arms and lower legs. It is very important to get treated for this condition. Follow these instructions at home:  Take over-the-counter and prescription medicines only as told by your doctor.  If you were prescribed an antibiotic medicine, take it as told by your doctor. Do not stop taking the antibiotic even if you start to feel better.  Drink enough fluid to keep your pee (urine) clear or pale yellow.  Do not touch or rub the infected area.  Raise (elevate) the infected area above the level of your heart while you are sitting or lying down.  Place warm or cold wet cloths (warm or cold compresses) on the infected area. Do this as told by your doctor.  Keep all follow-up visits as told by your doctor. This is important. These visits let your doctor make sure your infection is not getting worse.  Contact a doctor if:  You have a fever.  Your symptoms do not get better after 1-2 days of treatment.  Your bone or joint under the infected area starts to hurt after the skin has healed.  Your infection comes back. This can happen in the same area or another area.  You have a swollen bump in the infected area.  You have new symptoms.  You feel ill and also have muscle aches and pains. Get help  right away if:  Your symptoms get worse.  You feel very sleepy.  You throw up (vomit) or have watery poop (diarrhea) for a long time.  There are red streaks coming from the infected area.  Your red area gets larger.  Your red area turns darker. This information is not intended to replace advice given to you by your health care provider. Make sure you discuss any questions you have with your health care provider. Document Released: 03/23/2008 Document Revised: 03/12/2016 Document Reviewed: 08/14/2015 Elsevier Interactive Patient Education  2018 [employer] Wound care: Cleanse wound to left arm with NS.  Apply Aquacel Ag to wound bed.  Cover with gauze and kerlix.  Change daily.

## 2018-05-25 LAB — HCV RNA QUANT
HCV Quantitative Log: 3.375 log10 IU/mL (ref >1.70)
HCV Quantitative: 2370 IU/mL (ref >50)

## 2018-05-27 LAB — CULTURE, BLOOD (ROUTINE X 2)
Culture: NO GROWTH
Culture: NO GROWTH
Special Requests: ADEQUATE

## 2018-06-03 ENCOUNTER — Ambulatory Visit: Payer: Self-pay | Admitting: Family Medicine

## 2018-06-08 ENCOUNTER — Ambulatory Visit: Payer: Self-pay | Admitting: Family Medicine

## 2018-08-04 ENCOUNTER — Encounter (HOSPITAL_COMMUNITY): Payer: Self-pay

## 2018-08-04 ENCOUNTER — Other Ambulatory Visit: Payer: Self-pay

## 2018-08-04 ENCOUNTER — Emergency Department (HOSPITAL_COMMUNITY)
Admission: EM | Admit: 2018-08-04 | Discharge: 2018-08-04 | Disposition: A | Discharge status: Home / Self Care | Payer: Self-pay | Attending: Emergency Medicine | Admitting: Emergency Medicine

## 2018-08-04 DIAGNOSIS — B1920 Unspecified viral hepatitis C without hepatic coma: Secondary | ICD-10-CM

## 2018-08-04 DIAGNOSIS — F17210 Nicotine dependence, cigarettes, uncomplicated: Secondary | ICD-10-CM | POA: Insufficient documentation

## 2018-08-04 DIAGNOSIS — Z79899 Other long term (current) drug therapy: Secondary | ICD-10-CM | POA: Insufficient documentation

## 2018-08-04 DIAGNOSIS — B182 Chronic viral hepatitis C: Secondary | ICD-10-CM | POA: Insufficient documentation

## 2018-08-04 DIAGNOSIS — L03113 Cellulitis of right upper limb: Secondary | ICD-10-CM | POA: Insufficient documentation

## 2018-08-04 DIAGNOSIS — O99320 Drug use complicating pregnancy, unspecified trimester: Secondary | ICD-10-CM

## 2018-08-04 DIAGNOSIS — F1910 Other psychoactive substance abuse, uncomplicated: Secondary | ICD-10-CM

## 2018-08-04 DIAGNOSIS — J45909 Unspecified asthma, uncomplicated: Secondary | ICD-10-CM | POA: Insufficient documentation

## 2018-08-04 DIAGNOSIS — L0291 Cutaneous abscess, unspecified: Secondary | ICD-10-CM

## 2018-08-04 LAB — COMPREHENSIVE METABOLIC PANEL
ALT: 32 U/L (ref 0–44)
AST: 28 U/L (ref 15–41)
Albumin: 3.5 g/dL (ref 3.5–5.0)
Alkaline Phosphatase: 71 U/L (ref 38–126)
Anion gap: 8 (ref 5–15)
BUN: <5 mg/dL — ABNORMAL LOW (ref 6–20)
CO2: 26 mmol/L (ref 22–32)
Calcium: 8.9 mg/dL (ref 8.9–10.3)
Chloride: 103 mmol/L (ref 98–111)
Creatinine, Ser: 0.89 mg/dL (ref 0.61–1.24)
GFR calc Af Amer: >60 mL/min (ref >60)
GFR calc non Af Amer: >60 mL/min (ref >60)
Glucose, Bld: 101 mg/dL — ABNORMAL HIGH (ref 70–99)
Potassium: 3.9 mmol/L (ref 3.5–5.1)
Sodium: 137 mmol/L (ref 135–145)
Total Bilirubin: 0.4 mg/dL (ref 0.3–1.2)
Total Protein: 8.6 g/dL — ABNORMAL HIGH (ref 6.5–8.1)

## 2018-08-04 LAB — CBC WITH DIFFERENTIAL/PLATELET
Abs Immature Granulocytes: 0.03 10*3/uL (ref 0.00–0.07)
Basophils Absolute: 0 10*3/uL (ref 0.0–0.1)
Basophils Relative: 0 %
Eosinophils Absolute: 0.1 10*3/uL (ref 0.0–0.5)
Eosinophils Relative: 1 %
HCT: 37 % — ABNORMAL LOW (ref 39.0–52.0)
Hemoglobin: 11.1 g/dL — ABNORMAL LOW (ref 13.0–17.0)
Immature Granulocytes: 0 %
Lymphocytes Relative: 29 %
Lymphs Abs: 3 10*3/uL (ref 0.7–4.0)
MCH: 27.2 pg (ref 26.0–34.0)
MCHC: 30 g/dL (ref 30.0–36.0)
MCV: 90.7 fL (ref 80.0–100.0)
Monocytes Absolute: 0.8 10*3/uL (ref 0.1–1.0)
Monocytes Relative: 8 %
Neutro Abs: 6.4 10*3/uL (ref 1.7–7.7)
Neutrophils Relative %: 62 %
Platelets: 337 10*3/uL (ref 150–400)
RBC: 4.08 MIL/uL — ABNORMAL LOW (ref 4.22–5.81)
RDW: 13.3 % (ref 11.5–15.5)
WBC: 10.3 10*3/uL (ref 4.0–10.5)
nRBC: 0 % (ref 0.0–0.2)

## 2018-08-04 LAB — I-STAT CG4 LACTIC ACID, ED: Lactic Acid, Venous: 1.02 mmol/L (ref 0.5–1.9)

## 2018-08-04 MED ORDER — LIDOCAINE-EPINEPHRINE (PF) 2 %-1:200000 IJ SOLN
10.0000 mL | Freq: Once | INTRAMUSCULAR | Status: AC
  Administered 2018-08-04: 16:00:00
  Filled 2018-08-04: qty 20

## 2018-08-04 MED ORDER — SULFAMETHOXAZOLE-TRIMETHOPRIM 800-160 MG PO TABS: 1.0000 | ORAL_TABLET | Freq: Two times a day (BID) | ORAL | 0 refills | Status: AC

## 2018-08-04 MED ORDER — SULFAMETHOXAZOLE-TRIMETHOPRIM 800-160 MG PO TABS
1.0000 | ORAL_TABLET | Freq: Once | ORAL | Status: AC
  Administered 2018-08-04: 1 via ORAL
  Filled 2018-08-04: qty 1

## 2018-08-04 MED ORDER — PENTAFLUOROPROP-TETRAFLUOROETH EX AERO
INHALATION_SPRAY | CUTANEOUS | Status: DC | PRN
  Administered 2018-08-04: 2 via TOPICAL
  Filled 2018-08-04 (×2): qty 103.5

## 2018-08-04 MED ORDER — KETOROLAC TROMETHAMINE 30 MG/ML IJ SOLN
30.0000 mg | Freq: Once | INTRAMUSCULAR | Status: AC
  Administered 2018-08-04: 30 mg via INTRAMUSCULAR
  Filled 2018-08-04: qty 1

## 2018-08-04 NOTE — ED Provider Notes (Signed)
Patient placed in Quick Look pathway, seen and evaluated   Chief Complaint: R arm abscess  HPI:   32 year old male presents with right forearm infection for the past several days. He admits to IVDU. Has a hx of the same on the left side and was admitted at that time. He states the infection is not as bad as it was last time but he is requesting to be "put to sleep" because he did not tolerate I&D well last time. No fevers  ROS: +right arm abscess  Physical Exam:   Gen: No distress  Neuro: Awake and Alert  Skin: Warm    Focused Exam: Heart: Tachycardic with regular rhythm    Lungs: CTA    Right forearm: Area which appears consistent with abscess/cellulitis on the forearm. Multiple scars on right and left forearms  Initiation of care has begun. The patient has been counseled on the process, plan, and necessity for staying for the completion/evaluation, and the remainder of the medical screening examination    Bethel Born, PA-C 08/04/18 1458    Terrilee Files, MD 08/05/18 1224

## 2018-08-04 NOTE — ED Provider Notes (Signed)
[name] EMERGENCY DEPARTMENT Provider Note   CSN: [id number] Arrival date & time: 08/04/18  1427     History   Chief Complaint Chief Complaint  Patient presents with  . Wound Infection    HPI Douglas Morgan is a 32 y.o. male.  Pt presents to the ED today with a wound infection to his right arm.  The pt said he has had the infection for the past few days.  He admits to IVDA in that area.  The pt denies f/c.  Pt had a left forearm infection in August and required admission.  The pt tested + for Hep C in August as well.  No f/u for that yet.     Past Medical History:  Diagnosis Date  . Asthma   . History of gout   . Leg pain   . Polysubstance abuse (HCC)     polysubstance use with methamphetamines and heroin use, opioid dependence on methadone /notes 05/22/2018  . Ulcer   . Varicose veins    "real bad BLE" (05/24/2018)    Patient Active Problem List   Diagnosis Date Noted  . Cellulitis of left arm 05/22/2018  . Hypoglycemia 12/26/2013  . Leukocytosis, unspecified 12/26/2013  . Fracture of iliac crest (HCC) 12/25/2013  . Varicose veins of lower extremities with other complications 11/14/2013  . Varicose veins of lower extremities with ulcer (HCC) 08/14/2013  . Varicose ulcer (HCC) 07/04/2013    Past Surgical History:  Procedure Laterality Date  . NO PAST SURGERIES          Home Medications    Prior to Admission medications   Medication Sig Start Date End Date Taking? Authorizing Provider  ibuprofen (ADVIL,MOTRIN) 400 MG tablet Take 1 tablet (400 mg total) by mouth every 8 (eight) hours. 05/24/18   Joseph Art, DO  methadone (DOLOPHINE) 10 MG/ML solution Take 110 mg by mouth daily.     [provider]  nicotine (NICODERM CQ - DOSED IN MG/24 HOURS) 14 mg/24hr patch Place 1 patch (14 mg total) onto the skin daily. 05/25/18   Joseph Art, DO  sulfamethoxazole-trimethoprim (BACTRIM DS,SEPTRA DS) 800-160 MG tablet Take 1 tablet by  mouth 2 (two) times daily for 7 days. 08/04/18 08/11/18  Jacalyn Lefevre, MD    Family History Family History  Problem Relation Age of Onset  . Depression Mother   . Diabetes Mother   . Hypertension Mother   . Heart disease Father        before age 22    Social History Social History   Tobacco Use  . Smoking status: Current Every Day Smoker    Packs/day: 0.50    Years: 10.00    Pack years: 5.00    Types: Cigarettes  . Smokeless tobacco: Never Used  Substance Use Topics  . Alcohol use: Not Currently    Frequency: Never    Comment: 05/24/2018 "quit 2018"  . Drug use: Yes    Types: Marijuana, Cocaine, Benzodiazepines, Heroin    Comment: 05/24/2018 "don't do heroin anymore; use the others weekly"     Allergies   Patient has no known allergies.   Review of Systems Review of Systems  Skin: Positive for color change and wound.  All other systems reviewed and are negative.    Physical Exam Updated Vital Signs BP 115/68   Pulse 87   Temp 97.8 F (36.6 C) (Oral)   Resp 18   Ht 6\' 3"  (1.905 m)  Wt 81.6 kg   SpO2 99%   BMI 22.50 kg/m   Physical Exam  Constitutional: He is oriented to person, place, and time. He appears well-developed and well-nourished.  HENT:  Head: Normocephalic and atraumatic.  Right Ear: External ear normal.  Left Ear: External ear normal.  Nose: Nose normal.  Mouth/Throat: Oropharynx is clear and moist.  Eyes: Pupils are equal, round, and reactive to light. Conjunctivae and EOM are normal.  Neck: Normal range of motion. Neck supple.  Cardiovascular: Normal rate, regular rhythm, normal heart sounds and intact distal pulses.  Pulmonary/Chest: Effort normal and breath sounds normal.  Abdominal: Soft. Bowel sounds are normal.  Musculoskeletal: Normal range of motion.  Neurological: He is alert and oriented to person, place, and time.  Skin: Skin is warm. Capillary refill takes less than 2 seconds.  Right forearm large abscess with fluctuance   Nursing note and vitals reviewed.    ED Treatments / Results  Labs (all labs ordered are listed, but only abnormal results are displayed) Labs Reviewed  COMPREHENSIVE METABOLIC PANEL - Abnormal; Notable for the following components:      Result Value   Glucose, Bld 101 (*)    BUN <5 (*)    Total Protein 8.6 (*)    All other components within normal limits  CBC WITH DIFFERENTIAL/PLATELET - Abnormal; Notable for the following components:   RBC 4.08 (*)    Hemoglobin 11.1 (*)    HCT 37.0 (*)    All other components within normal limits  I-STAT CG4 LACTIC ACID, ED  I-STAT CG4 LACTIC ACID, ED    EKG None  Radiology No results found.  Procedures .Marland KitchenIncision and Drainage Date/Time: 08/04/2018 4:06 PM Performed by: Jacalyn Lefevre, MD Authorized by: Jacalyn Lefevre, MD   Consent:    Consent obtained:  Verbal   Consent given by:  Patient   Risks discussed:  Incomplete drainage and pain   Alternatives discussed:  No treatment Location:    Type:  Abscess   Size:  10 by 10   Location:  Upper extremity   Upper extremity location:  Arm   Arm location:  R lower arm Pre-procedure details:    Skin preparation:  Betadine Anesthesia (see MAR for exact dosages):    Anesthesia method:  Local infiltration   Local anesthetic:  Lidocaine 2% WITH epi Procedure type:    Complexity:  Simple Procedure details:    Incision types:  Cruciate   Scalpel blade:  11   Drainage amount:  Copious   Wound treatment:  Wound left open   Packing materials:  None Post-procedure details:    Patient tolerance of procedure:  Procedure terminated at patient's request Comments:     I was able to remove a lot of pus; however, pt requested termination of procedure before I could probe and irrigate.  Pt understands it can get worse.   (including critical care time)  Medications Ordered in ED Medications  pentafluoroprop-tetrafluoroeth (GEBAUERS) aerosol (2 application Topical Given 08/04/18 1607)    lidocaine-EPINEPHrine (XYLOCAINE W/EPI) 2 %-1:200000 (PF) injection 10 mL ( Infiltration Given 08/04/18 1547)  sulfamethoxazole-trimethoprim (BACTRIM DS,SEPTRA DS) 800-160 MG per tablet 1 tablet (1 tablet Oral Given 08/04/18 1547)  ketorolac (TORADOL) 30 MG/ML injection 30 mg (30 mg Intramuscular Given 08/04/18 1546)     Initial Impression / Assessment and Plan / ED Course  I have reviewed the triage vital signs and the nursing notes.  Pertinent labs & imaging results that were available during my care  of the patient were reviewed by me and considered in my medical decision making (see chart for details).    Pt and his mom were not aware of the results of his Hep C RNA testing, so he is told that these results are +.  He is instructed to f/u with Dr. Drue Second from ID.  He is told to return if worse and to take the bactrim.  Avoid all IV drugs.  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

## 2018-08-04 NOTE — ED Notes (Signed)
MD performed I&D on pt's right forearm. Purulent drainage, foul odor. RN applied gauze dressing over site for pt to discharge home with.

## 2018-08-04 NOTE — Discharge Instructions (Addendum)
Stop using IV drugs.

## 2018-08-04 NOTE — ED Triage Notes (Signed)
Pt endorses infection to right arm due to IV drug use. Last heroin use 3 days ago. Redness noted, pt has hx of same in left arm. Tachy, afebrile.

## 2020-10-25 ENCOUNTER — Other Ambulatory Visit: Payer: Self-pay

## 2020-10-25 ENCOUNTER — Emergency Department (HOSPITAL_COMMUNITY)
Admission: EM | Admit: 2020-10-25 | Discharge: 2020-10-26 | Disposition: A | Discharge status: Home / Self Care | Payer: Self-pay | Attending: Emergency Medicine | Admitting: Emergency Medicine

## 2020-10-25 ENCOUNTER — Encounter (HOSPITAL_COMMUNITY): Payer: Self-pay

## 2020-10-25 DIAGNOSIS — Z5321 Procedure and treatment not carried out due to patient leaving prior to being seen by health care provider: Secondary | ICD-10-CM | POA: Insufficient documentation

## 2020-10-25 DIAGNOSIS — M79606 Pain in leg, unspecified: Secondary | ICD-10-CM | POA: Insufficient documentation

## 2020-10-25 LAB — BASIC METABOLIC PANEL
Anion gap: 11 (ref 5–15)
BUN: 10 mg/dL (ref 6–20)
CO2: 22 mmol/L (ref 22–32)
Calcium: 9.6 mg/dL (ref 8.9–10.3)
Chloride: 105 mmol/L (ref 98–111)
Creatinine, Ser: 1.21 mg/dL (ref 0.61–1.24)
GFR, Estimated: >60 mL/min (ref >60)
Glucose, Bld: 134 mg/dL — ABNORMAL HIGH (ref 70–99)
Potassium: 4.5 mmol/L (ref 3.5–5.1)
Sodium: 138 mmol/L (ref 135–145)

## 2020-10-25 LAB — CBC
HCT: 35.7 % — ABNORMAL LOW (ref 39.0–52.0)
Hemoglobin: 11.8 g/dL — ABNORMAL LOW (ref 13.0–17.0)
MCH: 29.3 pg (ref 26.0–34.0)
MCHC: 33.1 g/dL (ref 30.0–36.0)
MCV: 88.6 fL (ref 80.0–100.0)
Platelets: 263 10*3/uL (ref 150–400)
RBC: 4.03 MIL/uL — ABNORMAL LOW (ref 4.22–5.81)
RDW: 12.9 % (ref 11.5–15.5)
WBC: 4.7 10*3/uL (ref 4.0–10.5)
nRBC: 0 % (ref 0.0–0.2)

## 2020-10-25 LAB — D-DIMER, QUANTITATIVE (NOT AT ARMC): D-Dimer, Quant: <0.27 ug/mL-FEU (ref 0.00–0.50)

## 2020-10-25 NOTE — ED Triage Notes (Signed)
Pt states that for the "last few days" he's experienced "excruciating" leg pain. Hx varicose veins. States yesterday evening legs began to throb, & legs were hot to touch/ swollen. Distal pulses/ sensation intact, good cap refill. Denies DVT hx, not on [facility]

## 2020-10-26 NOTE — ED Notes (Signed)
Pt called x 3  No answer.
